# Patient Record
Sex: Female | Born: 1940 | Race: Black or African American | Hispanic: No | Marital: Married
Health system: Southern US, Community
[De-identification: ages and names within clinical notes are randomized; demographics above are authoritative.]

## PROBLEM LIST (undated history)

## (undated) DIAGNOSIS — M79661 Pain in right lower leg: Secondary | ICD-10-CM

## (undated) DIAGNOSIS — N189 Chronic kidney disease, unspecified: Secondary | ICD-10-CM

## (undated) DIAGNOSIS — E119 Type 2 diabetes mellitus without complications: Secondary | ICD-10-CM

## (undated) DIAGNOSIS — I4891 Unspecified atrial fibrillation: Secondary | ICD-10-CM

## (undated) DIAGNOSIS — H43399 Other vitreous opacities, unspecified eye: Secondary | ICD-10-CM

## (undated) DIAGNOSIS — E7800 Pure hypercholesterolemia, unspecified: Secondary | ICD-10-CM

## (undated) DIAGNOSIS — J3489 Other specified disorders of nose and nasal sinuses: Secondary | ICD-10-CM

## (undated) DIAGNOSIS — R001 Bradycardia, unspecified: Secondary | ICD-10-CM

## (undated) DIAGNOSIS — M79672 Pain in left foot: Secondary | ICD-10-CM

## (undated) DIAGNOSIS — T783XXA Angioneurotic edema, initial encounter: Secondary | ICD-10-CM

## (undated) DIAGNOSIS — I10 Essential (primary) hypertension: Secondary | ICD-10-CM

## (undated) DIAGNOSIS — M79671 Pain in right foot: Secondary | ICD-10-CM

## (undated) HISTORY — DX: Other specified disorders of nose and nasal sinuses: J34.89

## (undated) HISTORY — DX: Pain in right foot: M79.671

## (undated) HISTORY — DX: Bradycardia, unspecified: R00.1

## (undated) HISTORY — DX: Unspecified atrial fibrillation: I48.91

## (undated) HISTORY — PX: PACEMAKER IMPLANT: EP1218

## (undated) HISTORY — PX: LEG SURGERY: SHX1003

## (undated) HISTORY — DX: Pain in right lower leg: M79.661

## (undated) HISTORY — DX: Chronic kidney disease, unspecified: N18.9

## (undated) HISTORY — DX: Other vitreous opacities, unspecified eye: H43.399

## (undated) HISTORY — DX: Angioneurotic edema, initial encounter: T78.3XXA

## (undated) HISTORY — DX: Pain in right foot: M79.672

## (undated) NOTE — *Deleted (*Deleted)
   08/26/20 1428  Assess: MEWS Score  BP (!) 206/73  Pulse Rate 77  SpO2 100 %  O2 Device Room Air  Assess: MEWS Score  MEWS Temp 0  MEWS Systolic 2  MEWS Pulse 0  MEWS RR 0  MEWS LOC 0  MEWS Score 2  MEWS Score Color Yellow  Treat  MEWS Interventions Administered scheduled meds/treatments;Administered prn meds/treatments  Pain Scale 0-10  Pain Score 0  Take Vital Signs  Increase Vital Sign Frequency  Yellow: Q 2hr X 2 then Q 4hr X 2, if remains yellow, continue Q 4hrs  Escalate  MEWS: Escalate Yellow: discuss with charge nurse/RN and consider discussing with provider and RRT  Notify: Charge Nurse/RN  Name of Charge Nurse/RN Notified Luevenia Maxin, RN  Date Charge Nurse/RN Notified 08/26/20  Time Charge Nurse/RN Notified [id number]  Notify: Provider  Provider Name/Title [facility]  Date Provider Notified 08/26/20  Time Provider Notified 1440  Notification Type Page  Notification Reason Other (Comment) (High blood pressure)  Response No new orders  Date of Provider Response 08/26/20  Time of Provider Response 1500  Notify: Rapid Response  Name of Rapid Response RN Notified N/A  Document  Patient Outcome Stabilized after interventions  Progress note created (see row info) Yes  Pt's b/p consistently high.  MD notified.  Per MD give Hydralazine 5mg  PRN first.  PRN meds given. Will continue to monitor

---

## 2013-06-23 ENCOUNTER — Emergency Department (HOSPITAL_BASED_OUTPATIENT_CLINIC_OR_DEPARTMENT_OTHER)
Admission: EM | Admit: 2013-06-23 | Discharge: 2013-06-23 | Disposition: A | Discharge status: Home / Self Care | Payer: Medicare Other | Attending: Emergency Medicine | Admitting: Emergency Medicine

## 2013-06-23 ENCOUNTER — Emergency Department (HOSPITAL_BASED_OUTPATIENT_CLINIC_OR_DEPARTMENT_OTHER): Payer: Medicare Other

## 2013-06-23 ENCOUNTER — Encounter (HOSPITAL_BASED_OUTPATIENT_CLINIC_OR_DEPARTMENT_OTHER): Payer: Self-pay

## 2013-06-23 DIAGNOSIS — H6121 Impacted cerumen, right ear: Secondary | ICD-10-CM

## 2013-06-23 DIAGNOSIS — E7800 Pure hypercholesterolemia, unspecified: Secondary | ICD-10-CM | POA: Insufficient documentation

## 2013-06-23 DIAGNOSIS — R51 Headache: Secondary | ICD-10-CM | POA: Insufficient documentation

## 2013-06-23 DIAGNOSIS — R42 Dizziness and giddiness: Secondary | ICD-10-CM | POA: Insufficient documentation

## 2013-06-23 DIAGNOSIS — E119 Type 2 diabetes mellitus without complications: Secondary | ICD-10-CM | POA: Insufficient documentation

## 2013-06-23 DIAGNOSIS — I10 Essential (primary) hypertension: Secondary | ICD-10-CM | POA: Insufficient documentation

## 2013-06-23 DIAGNOSIS — Z79899 Other long term (current) drug therapy: Secondary | ICD-10-CM | POA: Insufficient documentation

## 2013-06-23 DIAGNOSIS — Z7982 Long term (current) use of aspirin: Secondary | ICD-10-CM | POA: Insufficient documentation

## 2013-06-23 DIAGNOSIS — H6120 Impacted cerumen, unspecified ear: Secondary | ICD-10-CM | POA: Insufficient documentation

## 2013-06-23 HISTORY — DX: Pure hypercholesterolemia, unspecified: E78.00

## 2013-06-23 HISTORY — DX: Essential (primary) hypertension: I10

## 2013-06-23 HISTORY — DX: Type 2 diabetes mellitus without complications: E11.9

## 2013-06-23 LAB — URINALYSIS, ROUTINE W REFLEX MICROSCOPIC
Bilirubin Urine: NEGATIVE
Leukocytes, UA: NEGATIVE
Nitrite: NEGATIVE
Specific Gravity, Urine: 1.004 — ABNORMAL LOW (ref 1.005–1.030)
Urobilinogen, UA: 0.2 mg/dL (ref 0.0–1.0)
pH: 6.5 (ref 5.0–8.0)

## 2013-06-23 LAB — BASIC METABOLIC PANEL
Chloride: 104 mEq/L (ref 96–112)
GFR calc Af Amer: 51 mL/min — ABNORMAL LOW (ref >90)
GFR calc non Af Amer: 44 mL/min — ABNORMAL LOW (ref >90)
Potassium: 3.6 mEq/L (ref 3.5–5.1)
Sodium: 142 mEq/L (ref 135–145)

## 2013-06-23 LAB — CBC WITH DIFFERENTIAL/PLATELET
Basophils Absolute: 0 10*3/uL (ref 0.0–0.1)
Basophils Relative: 0 % (ref 0–1)
Eosinophils Absolute: 0.1 10*3/uL (ref 0.0–0.7)
Hemoglobin: 12.3 g/dL (ref 12.0–15.0)
MCH: 31.1 pg (ref 26.0–34.0)
MCHC: 33.4 g/dL (ref 30.0–36.0)
Monocytes Relative: 9 % (ref 3–12)
Neutro Abs: 3.4 10*3/uL (ref 1.7–7.7)
Neutrophils Relative %: 63 % (ref 43–77)
Platelets: 276 10*3/uL (ref 150–400)
RDW: 13 % (ref 11.5–15.5)

## 2013-06-23 LAB — GLUCOSE, CAPILLARY: Glucose-Capillary: 122 mg/dL — ABNORMAL HIGH (ref 70–99)

## 2013-06-23 MED ORDER — MECLIZINE HCL 25 MG PO TABS
25.0000 mg | ORAL_TABLET | Freq: Once | ORAL | Status: AC
  Administered 2013-06-23: 25 mg via ORAL
  Filled 2013-06-23: qty 1

## 2013-06-23 MED ORDER — SODIUM CHLORIDE 0.9 % IV BOLUS (SEPSIS)
1000.0000 mL | Freq: Once | INTRAVENOUS | Status: AC
  Administered 2013-06-23: 1000 mL via INTRAVENOUS

## 2013-06-23 MED ORDER — MECLIZINE HCL 25 MG PO TABS: 25.0000 mg | ORAL_TABLET | Freq: Three times a day (TID) | ORAL | Status: DC | PRN

## 2013-06-23 NOTE — ED Notes (Signed)
Pt. Is in no distress with no s/s of shortness of breath noted.  Pt. Has no complaints.

## 2013-06-23 NOTE — ED Provider Notes (Signed)
CSN: [id number]     Arrival date & time 06/23/13  1310 History     First MD Initiated Contact with Patient 06/23/13 1343     Chief Complaint  Patient presents with  . Dizziness   (Consider location/radiation/quality/duration/timing/severity/associated sxs/prior Treatment) HPI Pt with history of HTN, DM reports she was sitting in her house earlier today when she began to have room-spinning dizziness, associated with headache diffuse mild and throbbing, not associated with ringing in ears, ataxia or nausea. She has not had similar symptoms before. No previous CAD or stroke.   Past Medical History  Diagnosis Date  . Diabetes mellitus without complication   . Hypertension   . High cholesterol    Past Surgical History  Procedure Laterality Date  . Leg surgery     No family history on file. History  Substance Use Topics  . Smoking status: Never Smoker   . Smokeless tobacco: Not on file  . Alcohol Use: Yes   OB History   Grav Para Term Preterm Abortions TAB SAB Ect Mult Living                 Review of Systems All other systems reviewed and are negative except as noted in HPI.   Allergies  Ace inhibitors; Augmentin; Benicar; and Ketek  Home Medications   Current Outpatient Rx  Name  Route  Sig  Dispense  Refill  . aspirin 81 MG tablet   Oral   Take 81 mg by mouth daily.         Marland Kitchen METFORMIN HCL PO   Oral   Take by mouth.         Marland Kitchen NIFEdipine (PROCARDIA PO)   Oral   Take by mouth.         . OMEPRAZOLE PO   Oral   Take by mouth.         Marland Kitchen PRAVASTATIN SODIUM PO   Oral   Take by mouth.         . SitaGLIPtin Phosphate (JANUVIA PO)   Oral   Take by mouth.          BP 173/66  Pulse 83  Temp(Src) 98.8 F (37.1 C) (Oral)  Resp 20  Ht 5\' 4"  (1.626 m)  Wt 174 lb (78.926 kg)  BMI 29.85 kg/m2  SpO2 96% Physical Exam  Nursing note and vitals reviewed. Constitutional: She is oriented to person, place, and time. She appears well-developed and  well-nourished.  HENT:  Head: Normocephalic and atraumatic.  R TM obscured by cerumen impaction; left side is clear with normal TM  Eyes: EOM are normal. Pupils are equal, round, and reactive to light.  Neck: Normal range of motion. Neck supple.  Cardiovascular: Normal rate, normal heart sounds and intact distal pulses.   Pulmonary/Chest: Effort normal and breath sounds normal.  Abdominal: Bowel sounds are normal. She exhibits no distension. There is no tenderness.  Musculoskeletal: Normal range of motion. She exhibits no edema and no tenderness.  Neurological: She is alert and oriented to person, place, and time. She has normal strength. She displays normal reflexes. No cranial nerve deficit or sensory deficit. She exhibits normal muscle tone. Coordination normal.  Skin: Skin is warm and dry. No rash noted.  Psychiatric: She has a normal mood and affect.    ED Course   Procedures (including critical care time)  Cerumen disimpaction by curette, large amount of dry cerumen removed from R ear, TM clear. No perforation, drainage or bleeding.  Labs Reviewed  GLUCOSE, CAPILLARY - Abnormal; Notable for the following:    Glucose-Capillary 122 (*)    All other components within normal limits  BASIC METABOLIC PANEL - Abnormal; Notable for the following:    Glucose, Bld 137 (*)    Creatinine, Ser 1.20 (*)    GFR calc non Af Amer 44 (*)    GFR calc Af Amer 51 (*)    All other components within normal limits  URINALYSIS, ROUTINE W REFLEX MICROSCOPIC - Abnormal; Notable for the following:    Specific Gravity, Urine 1.004 (*)    All other components within normal limits  CBC WITH DIFFERENTIAL   Ct Head Wo Contrast  06/23/2013   *RADIOLOGY REPORT*  Clinical Data: Dizziness and headache.  CT HEAD WITHOUT CONTRAST  Technique:  Contiguous axial images were obtained from the base of the skull through the vertex without contrast.  Comparison: No priors.  Findings: Mild cerebral and cerebellar  atrophy is age appropriate. There are some patchy areas of very subtle decreased attenuation throughout the deep and periventricular white matter of the cerebral hemispheres bilaterally, compatible with mild chronic microvascular ischemic changes. No acute intracranial abnormalities.  Specifically, no evidence of acute intracranial hemorrhage, no definite findings of acute/subacute cerebral ischemia, no mass, mass effect, hydrocephalus or abnormal intra or extra-axial fluid collections.  Visualized paranasal sinuses and mastoids are well pneumatized.  No acute displaced skull fractures are identified.  IMPRESSION: 1.  No acute intracranial abnormalities. 2.  Mild cerebral and cerebellar atrophy and mild chronic microvascular ischemic changes in the cerebral white matter, as above.   Original Report Authenticated By: Vinnie Langton, M.D.   1. Vertigo   2. Cerumen impaction, right     MDM   Date: 06/23/2013  Rate: 71  Rhythm: normal sinus rhythm  QRS Axis: normal  Intervals: normal  ST/T Wave abnormalities: normal  Conduction Disutrbances:none  Narrative Interpretation:   Old EKG Reviewed: none available  Pt with mild peripheral vertigo symptoms, no ataxia, likely related to cerumen impaction. Advised meclizine if needed and PCP followup if symptoms persist.    Juanda Crumble B. Karle Starch, MD 06/23/13 1517

## 2013-06-23 NOTE — ED Notes (Signed)
C/o dizziness started 11am-HA started 1130am-pain is across forehead-denies n/v,speech/swallowing difficulty-pt was ambulated from ED WR to tx area w/o difficulty

## 2017-09-18 ENCOUNTER — Emergency Department (HOSPITAL_BASED_OUTPATIENT_CLINIC_OR_DEPARTMENT_OTHER)
Admission: EM | Admit: 2017-09-18 | Discharge: 2017-09-18 | Disposition: A | Discharge status: Home / Self Care | Payer: Medicare HMO | Attending: Physician Assistant | Admitting: Physician Assistant

## 2017-09-18 ENCOUNTER — Encounter (HOSPITAL_BASED_OUTPATIENT_CLINIC_OR_DEPARTMENT_OTHER): Payer: Self-pay

## 2017-09-18 DIAGNOSIS — Z7984 Long term (current) use of oral hypoglycemic drugs: Secondary | ICD-10-CM | POA: Insufficient documentation

## 2017-09-18 DIAGNOSIS — Z79899 Other long term (current) drug therapy: Secondary | ICD-10-CM | POA: Insufficient documentation

## 2017-09-18 DIAGNOSIS — E119 Type 2 diabetes mellitus without complications: Secondary | ICD-10-CM | POA: Diagnosis not present

## 2017-09-18 DIAGNOSIS — I10 Essential (primary) hypertension: Secondary | ICD-10-CM | POA: Diagnosis not present

## 2017-09-18 DIAGNOSIS — T783XXA Angioneurotic edema, initial encounter: Secondary | ICD-10-CM | POA: Diagnosis not present

## 2017-09-18 DIAGNOSIS — R600 Localized edema: Secondary | ICD-10-CM | POA: Diagnosis present

## 2017-09-18 DIAGNOSIS — Z7982 Long term (current) use of aspirin: Secondary | ICD-10-CM | POA: Diagnosis not present

## 2017-09-18 LAB — CBC WITH DIFFERENTIAL/PLATELET
BASOS PCT: 0 %
Basophils Absolute: 0 10*3/uL (ref 0.0–0.1)
EOS ABS: 0.2 10*3/uL (ref 0.0–0.7)
EOS PCT: 3 %
HEMATOCRIT: 37.5 % (ref 36.0–46.0)
Hemoglobin: 12.3 g/dL (ref 12.0–15.0)
Lymphocytes Relative: 26 %
Lymphs Abs: 1.8 10*3/uL (ref 0.7–4.0)
MCH: 30.8 pg (ref 26.0–34.0)
MCHC: 32.8 g/dL (ref 30.0–36.0)
MCV: 93.8 fL (ref 78.0–100.0)
MONO ABS: 0.6 10*3/uL (ref 0.1–1.0)
MONOS PCT: 9 %
NEUTROS ABS: 4.2 10*3/uL (ref 1.7–7.7)
Neutrophils Relative %: 62 %
PLATELETS: 261 10*3/uL (ref 150–400)
RBC: 4 MIL/uL (ref 3.87–5.11)
RDW: 13.1 % (ref 11.5–15.5)
WBC: 6.7 10*3/uL (ref 4.0–10.5)

## 2017-09-18 LAB — BASIC METABOLIC PANEL
Anion gap: 8 (ref 5–15)
BUN: 22 mg/dL — ABNORMAL HIGH (ref 6–20)
CHLORIDE: 107 mmol/L (ref 101–111)
CO2: 25 mmol/L (ref 22–32)
CREATININE: 1.36 mg/dL — AB (ref 0.44–1.00)
Calcium: 9.1 mg/dL (ref 8.9–10.3)
GFR, EST AFRICAN AMERICAN: 42 mL/min — AB (ref >60)
GFR, EST NON AFRICAN AMERICAN: 36 mL/min — AB (ref >60)
Glucose, Bld: 153 mg/dL — ABNORMAL HIGH (ref 65–99)
POTASSIUM: 3.5 mmol/L (ref 3.5–5.1)
Sodium: 140 mmol/L (ref 135–145)

## 2017-09-18 MED ORDER — METHYLPREDNISOLONE SODIUM SUCC 125 MG IJ SOLR
125.0000 mg | Freq: Once | INTRAMUSCULAR | Status: AC
  Administered 2017-09-18: 125 mg via INTRAVENOUS
  Filled 2017-09-18: qty 2

## 2017-09-18 MED ORDER — FAMOTIDINE IN NACL 20-0.9 MG/50ML-% IV SOLN
20.0000 mg | Freq: Once | INTRAVENOUS | Status: AC
  Administered 2017-09-18: 20 mg via INTRAVENOUS
  Filled 2017-09-18: qty 50

## 2017-09-18 MED ORDER — DIPHENHYDRAMINE HCL 25 MG PO CAPS
25.0000 mg | ORAL_CAPSULE | Freq: Once | ORAL | Status: AC
Start: 2017-09-18 — End: 2017-09-18
  Administered 2017-09-18: 25 mg via ORAL
  Filled 2017-09-18: qty 1

## 2017-09-18 MED ORDER — DIPHENHYDRAMINE HCL 25 MG PO CAPS: 50.0000 mg | ORAL_CAPSULE | Freq: Once | ORAL | Status: DC

## 2017-09-18 NOTE — Discharge Instructions (Signed)
Please stay away from any products that have caused swelling.  Please return to follow-up with the primary care physician.

## 2017-09-18 NOTE — ED Triage Notes (Signed)
Pt woke with her tongue swollen around 0400, she took 25mg  of benadryl at that time.  Pt is not having any difficulty breathing, able to speak in full sentences.  The only change to her medications is an addition of a vaginal cream, no new foods either

## 2017-09-18 NOTE — ED Provider Notes (Signed)
[facility] Provider Note: Georgena Spurling, MD, FACEP  CSN: [id number] MRN: [id number] ARRIVAL: 09/18/17 at [place]: MHOTF/OTF   CHIEF COMPLAINT  Oral Swelling   HISTORY OF PRESENT ILLNESS  09/18/17 6:37 AM Angie Barrett is a 76 y.o. 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  She is not currently on either class of drug.  She was recently started on vaginal clotrimazole.  She is here with swelling of her tongue which began about 4 AM.  She took 25 mg of Benadryl orally at that time.  She denies difficulty breathing or difficulty speaking but did have difficulty swallowing before taking the Benadryl.  Symptoms are mild to moderate at the present time.  She has chronic edema of the lower extremities for which she wears compression stockings.   Past Medical History:  Diagnosis Date  . Diabetes mellitus without complication ([place])   . High cholesterol   . Hypertension     Past Surgical History:  Procedure Laterality Date  . LEG SURGERY      No family history on file.  Social History  Substance Use Topics  . Smoking status: Never Smoker  . Smokeless tobacco: Not on file  . Alcohol use Yes    Prior to Admission medications   Medication Sig Start Date End Date Taking? Authorizing Provider  aspirin 81 MG tablet Take 81 mg by mouth daily.    [provider]  meclizine (ANTIVERT) 25 MG tablet Take 1 tablet (25 mg total) by mouth 3 (three) times daily as needed. 06/23/13   Calvert Cantor, MD  METFORMIN HCL PO Take by mouth.    [provider]  NIFEdipine (PROCARDIA PO) Take by mouth.    [provider]  OMEPRAZOLE PO Take by mouth.    [provider]  PRAVASTATIN SODIUM PO Take by mouth.    [provider]  SitaGLIPtin Phosphate (JANUVIA PO) Take by mouth.    [provider]    Allergies Ace inhibitors; Augmentin [amoxicillin-pot clavulanate]; Benicar  [olmesartan]; and Ketek [telithromycin]   REVIEW OF SYSTEMS  Negative except as noted here or in the History of Present Illness.   PHYSICAL EXAMINATION  Initial Vital Signs Blood pressure (!) 183/74, pulse 82, temperature 98.6 F (37 C), temperature source Oral, resp. rate 18, height 5\' 4"  (1.626 m), weight 78.9 kg (174 lb), SpO2 100 %.  Examination General: Well-developed, well-nourished female in no acute distress; appearance consistent with age of record HENT: normocephalic; atraumatic; angioedema of tongue; no dysphonia; no stridor Eyes: pupils equal, round and reactive to light; extraocular muscles intact; lens implants Neck: supple Heart: regular rate and rhythm Lungs: clear to auscultation bilaterally Abdomen: soft; nondistended; nontender; bowel sounds present Extremities: No deformity; full range of motion; pulses normal; +1 edema of lower legs Neurologic: Awake, alert and oriented; motor function intact in all extremities and symmetric; no facial droop Skin: Warm and dry Psychiatric: Flat affect   RESULTS  Summary of this visit's results, reviewed by myself:   EKG Interpretation  Date/Time:    Ventricular Rate:    PR Interval:    QRS Duration:   QT Interval:    QTC Calculation:   R Axis:     Text Interpretation:        Laboratory Studies: Results for orders placed or performed during the hospital encounter of 09/18/17 (from the past 24 hour(s))  CBC with Differential/Platelet     Status: None   Collection Time:  09/18/17  5:51 AM  Result Value Ref Range   WBC 6.7 4.0 - 10.5 K/uL   RBC 4.00 3.87 - 5.11 MIL/uL   Hemoglobin 12.3 12.0 - 15.0 g/dL   HCT 37.5 36.0 - 46.0 %   MCV 93.8 78.0 - 100.0 fL   MCH 30.8 26.0 - 34.0 pg   MCHC 32.8 30.0 - 36.0 g/dL   RDW 13.1 11.5 - 15.5 %   Platelets 261 150 - 400 K/uL   Neutrophils Relative % 62 %   Neutro Abs 4.2 1.7 - 7.7 K/uL   Lymphocytes Relative 26 %   Lymphs Abs 1.8 0.7 - 4.0 K/uL   Monocytes Relative 9 %     Monocytes Absolute 0.6 0.1 - 1.0 K/uL   Eosinophils Relative 3 %   Eosinophils Absolute 0.2 0.0 - 0.7 K/uL   Basophils Relative 0 %   Basophils Absolute 0.0 0.0 - 0.1 K/uL  Basic metabolic panel     Status: Abnormal   Collection Time: 09/18/17  5:51 AM  Result Value Ref Range   Sodium 140 135 - 145 mmol/L   Potassium 3.5 3.5 - 5.1 mmol/L   Chloride 107 101 - 111 mmol/L   CO2 25 22 - 32 mmol/L   Glucose, Bld 153 (H) 65 - 99 mg/dL   BUN 22 (H) 6 - 20 mg/dL   Creatinine, Ser 1.36 (H) 0.44 - 1.00 mg/dL   Calcium 9.1 8.9 - 10.3 mg/dL   GFR calc non Af Amer 36 (L) >60 mL/min   GFR calc Af Amer 42 (L) >60 mL/min   Anion gap 8 5 - 15   Imaging Studies: No results found.  ED COURSE  Nursing notes and initial vitals signs, including pulse oximetry, reviewed.  Vitals:   09/18/17 0540 09/18/17 0915  BP: (!) 183/74 (!) 149/72  Pulse: 82 68  Resp: 18 18  Temp: 98.6 F (37 C)   TempSrc: Oral   SpO2: 100% 97%  Weight: 78.9 kg (174 lb)   Height: 5\' 4"  (1.626 m)    6:47 AM Patient states she is feeling better after additional 25 mg of Benadryl, 20 mg of IV Pepcid and 125 mg of IV Solu-Medrol.  Angioedema is still present.  We will continue to observe.    PROCEDURES    ED DIAGNOSES     ICD-10-CM   1. Angioedema, initial encounter T78.Mechele Dawley, MD 09/18/17 2243

## 2017-09-20 ENCOUNTER — Other Ambulatory Visit: Payer: Self-pay

## 2017-09-20 ENCOUNTER — Emergency Department (HOSPITAL_BASED_OUTPATIENT_CLINIC_OR_DEPARTMENT_OTHER): Payer: Medicare HMO

## 2017-09-20 ENCOUNTER — Encounter (HOSPITAL_BASED_OUTPATIENT_CLINIC_OR_DEPARTMENT_OTHER): Payer: Self-pay | Admitting: Emergency Medicine

## 2017-09-20 ENCOUNTER — Emergency Department (HOSPITAL_BASED_OUTPATIENT_CLINIC_OR_DEPARTMENT_OTHER)
Admission: EM | Admit: 2017-09-20 | Discharge: 2017-09-20 | Disposition: A | Discharge status: Home / Self Care | Payer: Medicare HMO | Attending: Emergency Medicine | Admitting: Emergency Medicine

## 2017-09-20 DIAGNOSIS — Z7984 Long term (current) use of oral hypoglycemic drugs: Secondary | ICD-10-CM | POA: Diagnosis not present

## 2017-09-20 DIAGNOSIS — I10 Essential (primary) hypertension: Secondary | ICD-10-CM | POA: Insufficient documentation

## 2017-09-20 DIAGNOSIS — R600 Localized edema: Secondary | ICD-10-CM | POA: Insufficient documentation

## 2017-09-20 DIAGNOSIS — E119 Type 2 diabetes mellitus without complications: Secondary | ICD-10-CM | POA: Insufficient documentation

## 2017-09-20 DIAGNOSIS — R42 Dizziness and giddiness: Secondary | ICD-10-CM

## 2017-09-20 DIAGNOSIS — R0602 Shortness of breath: Secondary | ICD-10-CM | POA: Diagnosis present

## 2017-09-20 DIAGNOSIS — Z7982 Long term (current) use of aspirin: Secondary | ICD-10-CM | POA: Insufficient documentation

## 2017-09-20 LAB — CBC WITH DIFFERENTIAL/PLATELET
BASOS ABS: 0.1 10*3/uL (ref 0.0–0.1)
Basophils Relative: 1 %
EOS ABS: 0.1 10*3/uL (ref 0.0–0.7)
EOS PCT: 1 %
HCT: 38.7 % (ref 36.0–46.0)
HEMOGLOBIN: 13 g/dL (ref 12.0–15.0)
LYMPHS ABS: 1.7 10*3/uL (ref 0.7–4.0)
LYMPHS PCT: 22 %
MCH: 31 pg (ref 26.0–34.0)
MCHC: 33.6 g/dL (ref 30.0–36.0)
MCV: 92.1 fL (ref 78.0–100.0)
Monocytes Absolute: 0.6 10*3/uL (ref 0.1–1.0)
Monocytes Relative: 7 %
NEUTROS PCT: 69 %
Neutro Abs: 5.3 10*3/uL (ref 1.7–7.7)
Platelets: 284 10*3/uL (ref 150–400)
RBC: 4.2 MIL/uL (ref 3.87–5.11)
RDW: 13.1 % (ref 11.5–15.5)
WBC: 7.7 10*3/uL (ref 4.0–10.5)

## 2017-09-20 LAB — BASIC METABOLIC PANEL
ANION GAP: 13 (ref 5–15)
BUN: 20 mg/dL (ref 6–20)
CHLORIDE: 101 mmol/L (ref 101–111)
CO2: 21 mmol/L — ABNORMAL LOW (ref 22–32)
Calcium: 9.4 mg/dL (ref 8.9–10.3)
Creatinine, Ser: 1.5 mg/dL — ABNORMAL HIGH (ref 0.44–1.00)
GFR calc Af Amer: 38 mL/min — ABNORMAL LOW (ref >60)
GFR calc non Af Amer: 32 mL/min — ABNORMAL LOW (ref >60)
GLUCOSE: 229 mg/dL — AB (ref 65–99)
POTASSIUM: 3.6 mmol/L (ref 3.5–5.1)
Sodium: 135 mmol/L (ref 135–145)

## 2017-09-20 LAB — CBG MONITORING, ED: GLUCOSE-CAPILLARY: 236 mg/dL — AB (ref 65–99)

## 2017-09-20 LAB — D-DIMER, QUANTITATIVE: D-Dimer, Quant: 0.93 ug/mL-FEU — ABNORMAL HIGH (ref 0.00–0.50)

## 2017-09-20 LAB — TROPONIN I: Troponin I: <0.03 ng/mL (ref <0.03)

## 2017-09-20 MED ORDER — SODIUM CHLORIDE 0.9 % IV BOLUS (SEPSIS)
250.0000 mL | Freq: Once | INTRAVENOUS | Status: AC
  Administered 2017-09-20: 250 mL via INTRAVENOUS

## 2017-09-20 NOTE — ED Triage Notes (Signed)
Reports shortness of breath x 1 hour PTA.  Reports dizziness as well.  Denies chest pain.

## 2017-09-20 NOTE — ED Provider Notes (Signed)
[facility] EMERGENCY DEPARTMENT Provider Note   CSN: [id number] Arrival date & time: 09/20/17  1155     History   Chief Complaint Chief Complaint  Patient presents with  . Shortness of Breath    HPI Angie Barrett is a 76 y.o. female.  The history is provided by the patient. No language interpreter was used.  Shortness of Breath    Angie Barrett is a 76 y.o. female who presents to the Emergency Department complaining of sob.  She reports shortness of breath and dizziness that happened about 1 hour prior to arrival.  Symptoms occurred while she was at rest.  She states that she feels like it is difficult to breathe and she feels like she might pass out.  Symptoms are constant in nature.  No fevers, chest pain, cough, abdominal pain, nausea, vomiting, diaphoresis.  She has chronic lower extremity edema and is unchanged from her baseline. Past Medical History:  Diagnosis Date  . Diabetes mellitus without complication ([place])   . High cholesterol   . Hypertension     There are no active problems to display for this patient.   Past Surgical History:  Procedure Laterality Date  . LEG SURGERY      OB History    No data available       Home Medications    Prior to Admission medications   Medication Sig Start Date End Date Taking? Authorizing Provider  aspirin 81 MG tablet Take 81 mg by mouth daily.    [provider]  meclizine (ANTIVERT) 25 MG tablet Take 1 tablet (25 mg total) by mouth 3 (three) times daily as needed. 06/23/13   Calvert Cantor, MD  METFORMIN HCL PO Take by mouth.    [provider]  NIFEdipine (PROCARDIA PO) Take by mouth.    [provider]  OMEPRAZOLE PO Take by mouth.    [provider]  PRAVASTATIN SODIUM PO Take by mouth.    [provider]  SitaGLIPtin Phosphate (JANUVIA PO) Take by mouth.    [provider]    Family History History reviewed. No pertinent family history.  Social  History Social History   Tobacco Use  . Smoking status: Never Smoker  . Smokeless tobacco: Never Used  Substance Use Topics  . Alcohol use: Yes  . Drug use: Not on file     Allergies   Ace inhibitors; Augmentin [amoxicillin-pot clavulanate]; Benicar [olmesartan]; and Ketek [telithromycin]   Review of Systems Review of Systems  Respiratory: Positive for shortness of breath.   All other systems reviewed and are negative.    Physical Exam Updated Vital Signs BP (!) 147/57   Pulse 76   Temp 98.1 F (36.7 C) (Oral)   Resp 17   Ht 5\' 4"  (1.626 m)   Wt 78.5 kg (173 lb)   SpO2 100%   BMI 29.70 kg/m   Physical Exam  Constitutional: She is oriented to person, place, and time. She appears well-developed and well-nourished.  HENT:  Head: Normocephalic and atraumatic.  Cardiovascular: Normal rate and regular rhythm.  No murmur heard. Pulmonary/Chest: Effort normal and breath sounds normal. No respiratory distress.  Abdominal: Soft. There is no tenderness. There is no rebound and no guarding.  Musculoskeletal: She exhibits no edema or tenderness.  Nonpitting edema to bilateral lower extremities.  No calf tenderness.  Neurological: She is alert and oriented to person, place, and time.  Skin: Skin is warm and dry.  Psychiatric: She has a normal  mood and affect. Her behavior is normal.  Nursing note and vitals reviewed.    ED Treatments / Results  Labs (all labs ordered are listed, but only abnormal results are displayed) Labs Reviewed  BASIC METABOLIC PANEL - Abnormal; Notable for the following components:      Result Value   CO2 21 (*)    Glucose, Bld 229 (*)    Creatinine, Ser 1.50 (*)    GFR calc non Af Amer 32 (*)    GFR calc Af Amer 38 (*)    All other components within normal limits  D-DIMER, QUANTITATIVE (NOT AT [facility]) - Abnormal; Notable for the following components:   D-Dimer, Quant 0.93 (*)    All other components within normal limits  CBG MONITORING, ED -  Abnormal; Notable for the following components:   Glucose-Capillary 236 (*)    All other components within normal limits  TROPONIN I  CBC WITH DIFFERENTIAL/PLATELET    EKG  EKG Interpretation  Date/Time:  Monday September 20 2017 12:11:19 EST Ventricular Rate:  85 PR Interval:    QRS Duration: 94 QT Interval:  413 QTC Calculation: 492 R Axis:   -9 Text Interpretation:  Sinus rhythm Borderline T wave abnormalities Borderline prolonged QT interval Baseline wander in lead(s) I II III aVR aVL aVF Confirmed by Quintella Reichert [phone]) on 09/20/2017 12:26:13 PM       Radiology Dg Chest 2 View  Result Date: 09/20/2017 CLINICAL DATA:  Acute onset of shortness of breath 1 hour ago associated with dizziness. Current history of hypertension, diabetes and hypercholesterolemia. EXAM: CHEST  2 VIEW COMPARISON:  08/31/2006. FINDINGS: Cardiac silhouette normal in size, unchanged. Thoracic aorta mildly atherosclerotic, unchanged. Hilar and mediastinal contours otherwise unremarkable. Lungs clear. Bronchovascular markings normal. Pulmonary vascularity normal. No visible pleural effusions. No pneumothorax. Degenerative changes involving the thoracic and upper lumbar spine. IMPRESSION: 1.  No acute cardiopulmonary disease. 2.  Aortic Atherosclerosis (ICD10-170.0) Electronically Signed   By: Evangeline Dakin M.D.   On: 09/20/2017 12:35   US Venous Img Lower Bilateral  Result Date: 09/20/2017 CLINICAL DATA:  Bilateral lower extremity swelling, shortness of breath. EXAM: BILATERAL LOWER EXTREMITY VENOUS DOPPLER ULTRASOUND TECHNIQUE: Gray-scale sonography with graded compression, as well as color Doppler and duplex ultrasound were performed to evaluate the lower extremity deep venous systems from the level of the common femoral vein and including the common femoral, femoral, profunda femoral, popliteal and calf veins including the posterior tibial, peroneal and gastrocnemius veins when visible. The superficial  great saphenous vein was also interrogated. Spectral Doppler was utilized to evaluate flow at rest and with distal augmentation maneuvers in the common femoral, femoral and popliteal veins. COMPARISON:  None. FINDINGS: RIGHT LOWER EXTREMITY Common Femoral Vein: No evidence of thrombus. Normal compressibility, respiratory phasicity and response to augmentation. Saphenofemoral Junction: No evidence of thrombus. Normal compressibility and flow on color Doppler imaging. Profunda Femoral Vein: No evidence of thrombus. Normal compressibility and flow on color Doppler imaging. Femoral Vein: No evidence of thrombus. Normal compressibility, respiratory phasicity and response to augmentation. Popliteal Vein: No evidence of thrombus. Normal compressibility, respiratory phasicity and response to augmentation. Calf Veins: No evidence of thrombus. Normal compressibility and flow on color Doppler imaging. Superficial Great Saphenous Vein: No evidence of thrombus. Normal compressibility. Venous Reflux:  None. Other Findings:  None. LEFT LOWER EXTREMITY Common Femoral Vein: No evidence of thrombus. Normal compressibility, respiratory phasicity and response to augmentation. Saphenofemoral Junction: No evidence of thrombus. Normal compressibility and flow on color Doppler  imaging. Profunda Femoral Vein: No evidence of thrombus. Normal compressibility and flow on color Doppler imaging. Femoral Vein: No evidence of thrombus. Normal compressibility, respiratory phasicity and response to augmentation. Popliteal Vein: No evidence of thrombus. Normal compressibility, respiratory phasicity and response to augmentation. Calf Veins: No evidence of thrombus. Normal compressibility and flow on color Doppler imaging. Superficial Great Saphenous Vein: No evidence of thrombus. Normal compressibility. Venous Reflux:  None. Other Findings:  None. IMPRESSION: No evidence of deep venous thrombosis seen in either lower extremity. Electronically Signed    By: Marijo Conception, M.D.   On: 09/20/2017 15:21    Procedures Procedures (including critical care time)  Medications Ordered in ED Medications  sodium chloride 0.9 % bolus 250 mL (0 mLs Intravenous Stopped 09/20/17 1453)     Initial Impression / Assessment and Plan / ED Course  I have reviewed the triage vital signs and the nursing notes.  Pertinent labs & imaging results that were available during my care of the patient were reviewed by me and considered in my medical decision making (see chart for details).     Patient here for evaluation of shortness of breath, lightheadedness.  Her symptoms have resolved in the emergency department without intervention.  Presentation is not consistent with ACS, dissection, acute CHF, pneumonia, PE.  D-dimer ordered in error and is slightly elevated, patient without PE risk factors and her sxs have resolved - do not feel additional work up for PE is indicated at this time.  Pt is not a candidate for CTA given her renal function  Discussed with patient unclear etiology of her shortness of breath and lightheadedness, now resolved.  Discussed outpatient follow-up and return precautions.   Final Clinical Impressions(s) / ED Diagnoses   Final diagnoses:  Shortness of breath  Lightheadedness    ED Discharge Orders    None       Quintella Reichert, MD 09/20/17 1536

## 2018-05-22 ENCOUNTER — Encounter (HOSPITAL_BASED_OUTPATIENT_CLINIC_OR_DEPARTMENT_OTHER): Payer: Self-pay | Admitting: Emergency Medicine

## 2018-05-22 ENCOUNTER — Other Ambulatory Visit: Payer: Self-pay

## 2018-05-22 ENCOUNTER — Emergency Department (HOSPITAL_BASED_OUTPATIENT_CLINIC_OR_DEPARTMENT_OTHER)
Admission: EM | Admit: 2018-05-22 | Discharge: 2018-05-23 | Disposition: A | Discharge status: Home / Self Care | Payer: Medicare HMO | Attending: Emergency Medicine | Admitting: Emergency Medicine

## 2018-05-22 DIAGNOSIS — I10 Essential (primary) hypertension: Secondary | ICD-10-CM

## 2018-05-22 DIAGNOSIS — E119 Type 2 diabetes mellitus without complications: Secondary | ICD-10-CM | POA: Diagnosis not present

## 2018-05-22 DIAGNOSIS — Z79899 Other long term (current) drug therapy: Secondary | ICD-10-CM | POA: Insufficient documentation

## 2018-05-22 DIAGNOSIS — Z7984 Long term (current) use of oral hypoglycemic drugs: Secondary | ICD-10-CM | POA: Insufficient documentation

## 2018-05-22 DIAGNOSIS — R519 Headache, unspecified: Secondary | ICD-10-CM

## 2018-05-22 DIAGNOSIS — Z7982 Long term (current) use of aspirin: Secondary | ICD-10-CM | POA: Insufficient documentation

## 2018-05-22 DIAGNOSIS — R51 Headache: Secondary | ICD-10-CM | POA: Insufficient documentation

## 2018-05-22 LAB — DIFFERENTIAL
BASOS PCT: 0 %
Basophils Absolute: 0 10*3/uL (ref 0.0–0.1)
EOS ABS: 0.1 10*3/uL (ref 0.0–0.7)
EOS PCT: 2 %
Lymphocytes Relative: 26 %
Lymphs Abs: 1.8 10*3/uL (ref 0.7–4.0)
MONO ABS: 0.6 10*3/uL (ref 0.1–1.0)
MONOS PCT: 10 %
Neutro Abs: 4.2 10*3/uL (ref 1.7–7.7)
Neutrophils Relative %: 62 %

## 2018-05-22 LAB — COMPREHENSIVE METABOLIC PANEL
ALBUMIN: 3.9 g/dL (ref 3.5–5.0)
ALT: 24 U/L (ref 0–44)
ANION GAP: 11 (ref 5–15)
AST: 25 U/L (ref 15–41)
Alkaline Phosphatase: 53 U/L (ref 38–126)
BUN: 29 mg/dL — ABNORMAL HIGH (ref 8–23)
CHLORIDE: 102 mmol/L (ref 98–111)
CO2: 28 mmol/L (ref 22–32)
Calcium: 8.9 mg/dL (ref 8.9–10.3)
Creatinine, Ser: 1.85 mg/dL — ABNORMAL HIGH (ref 0.44–1.00)
GFR calc Af Amer: 29 mL/min — ABNORMAL LOW (ref >60)
GFR, EST NON AFRICAN AMERICAN: 25 mL/min — AB (ref >60)
GLUCOSE: 164 mg/dL — AB (ref 70–99)
POTASSIUM: 3.6 mmol/L (ref 3.5–5.1)
Sodium: 141 mmol/L (ref 135–145)
TOTAL PROTEIN: 7.4 g/dL (ref 6.5–8.1)
Total Bilirubin: 0.3 mg/dL (ref 0.3–1.2)

## 2018-05-22 LAB — CBC
HEMATOCRIT: 37.4 % (ref 36.0–46.0)
Hemoglobin: 12.7 g/dL (ref 12.0–15.0)
MCH: 31.8 pg (ref 26.0–34.0)
MCHC: 34 g/dL (ref 30.0–36.0)
MCV: 93.7 fL (ref 78.0–100.0)
PLATELETS: 264 10*3/uL (ref 150–400)
RBC: 3.99 MIL/uL (ref 3.87–5.11)
RDW: 13 % (ref 11.5–15.5)
WBC: 6.8 10*3/uL (ref 4.0–10.5)

## 2018-05-22 LAB — TROPONIN I

## 2018-05-22 LAB — CBG MONITORING, ED: Glucose-Capillary: 160 mg/dL — ABNORMAL HIGH (ref 70–99)

## 2018-05-22 MED ORDER — HYDRALAZINE HCL 25 MG PO TABS: 25.0000 mg | ORAL_TABLET | Freq: Three times a day (TID) | ORAL | 0 refills | Status: DC

## 2018-05-22 MED ORDER — HYDRALAZINE HCL 25 MG PO TABS
25.0000 mg | ORAL_TABLET | Freq: Once | ORAL | Status: AC
  Administered 2018-05-22: 25 mg via ORAL
  Filled 2018-05-22: qty 1

## 2018-05-22 MED ORDER — ACETAMINOPHEN 500 MG PO TABS: 1000.0000 mg | ORAL_TABLET | Freq: Four times a day (QID) | ORAL | 0 refills | Status: AC | PRN

## 2018-05-22 MED ORDER — ACETAMINOPHEN 500 MG PO TABS
1000.0000 mg | ORAL_TABLET | Freq: Once | ORAL | Status: AC
  Administered 2018-05-22: 1000 mg via ORAL
  Filled 2018-05-22: qty 2

## 2018-05-22 NOTE — ED Triage Notes (Signed)
Patient states that she starred to have pain to the right side of her head about 2 days ago - the patient reports that it is now on the left side.

## 2018-05-22 NOTE — Discharge Instructions (Signed)
1.  Take hydralazine 25 mg 3 times a day.  If your blood pressure is less than 150/70, skip 1 dose.  And retake your blood pressure at the next scheduled dose. 2.  It is very important that you follow-up with your doctor as soon as possible this week for close blood pressure monitoring and response to treatment. 3.  You may also take acetaminophen every 6 hours for pain. 4.  Return to the emergency department if you develop worsening headache, weakness numbness or tingling, nausea or vomiting, changes in your vision or any other concerning symptoms.

## 2018-05-22 NOTE — ED Provider Notes (Signed)
[facility] EMERGENC[facility] DEPARTMENT Provider Note   CSN: [id number] Arrival date & time: 05/22/18  2004     History   Chief Complaint Chief Complaint  Patient presents with  . Headache    HPI Angie Barrett is a 77 y.o. female.  HPI Patient has had a waxing and waning headache for 2 days.  She reports it started on the left side around her temple and beside her ear.  Was gradual in onset a couple of days ago.  She reports the next day it moved to the left side in a similar area.  There is some aching and throbbing quality.  It  improved with Tylenol yesterday.   Patient initially denied that she actually had a headache at this time.  She sat forward and move her head about and then determined that it actually was back behind the right side again.  There have been no associated symptoms.  No nausea, no vomiting.  No visual changes.  No imbalance.  No weakness numbness or tingling.  Patient reports that she is compliant with her blood pressure medication.  She takes Procardia nightly.  She reports her blood pressure is usually well controlled, she reports systolic blood pressure typically in the 371I and diastolic in the 96V.(EMR review shows that ED visit [phone] blood pressure 183\74 and ED visit [phone] blood pressure 173/66 last outpatient PCP visit [phone] blood pressure 118/58) Past Medical History:  Diagnosis Date  . Diabetes mellitus without complication ([place])   . High cholesterol   . Hypertension     There are no active problems to display for this patient.   Past Surgical History:  Procedure Laterality Date  . LEG SURGERY       OB History   None      Home Medications    Prior to Admission medications   Medication Sig Start Date End Date Taking? Authorizing Provider  acetaminophen (TYLENOL) 500 MG tablet Take 2 tablets (1,000 mg total) by mouth every 6 (six) hours as needed. 05/22/18   Charlesetta Shanks, MD  aspirin 81 MG tablet Take 81 mg by mouth daily.     [provider]  hydrALAZINE (APRESOLINE) 25 MG tablet Take 1 tablet (25 mg total) by mouth 3 (three) times daily. 05/22/18   Charlesetta Shanks, MD  meclizine (ANTIVERT) 25 MG tablet Take 1 tablet (25 mg total) by mouth 3 (three) times daily as needed. 06/23/13   Calvert Cantor, MD  METFORMIN HCL PO Take by mouth.    [provider]  NIFEdipine (PROCARDIA PO) Take by mouth.    [provider]  OMEPRAZOLE PO Take by mouth.    [provider]  PRAVASTATIN SODIUM PO Take by mouth.    [provider]  SitaGLIPtin Phosphate (JANUVIA PO) Take by mouth.    [provider]    Family History History reviewed. No pertinent family history.  Social History Social History   Tobacco Use  . Smoking status: Never Smoker  . Smokeless tobacco: Never Used  Substance Use Topics  . Alcohol use: Yes  . Drug use: Not on file     Allergies   Ace inhibitors; Augmentin [amoxicillin-pot clavulanate]; Benicar [olmesartan]; and Ketek [telithromycin]   Review of Systems Review of Systems 10 Systems reviewed and are negative for acute change except as noted in the HPI.  Physical Exam Updated Vital Signs BP (!) 190/77   Pulse 66   Temp 98.8 F (37.1 C) (Oral)   Resp 13  Ht 5\' 4"  (1.626 m)   Wt 75.3 kg (166 lb)   SpO2 100%   BMI 28.49 kg/m   Physical Exam  Constitutional: She is oriented to person, place, and time. She appears well-developed and well-nourished. No distress.  HENT:  Head: Normocephalic and atraumatic.  Right Ear: External ear normal.  Left Ear: External ear normal.  Nose: Nose normal.  Mouth/Throat: Oropharynx is clear and moist.  Eyes: Pupils are equal, round, and reactive to light. EOM are normal.  Neck: Neck supple.  Cardiovascular: Normal rate, regular rhythm, normal heart sounds and intact distal pulses.  Pulmonary/Chest: Effort normal and breath sounds normal.  Abdominal: Soft. Bowel sounds are normal. She exhibits no  distension. There is no tenderness.  Musculoskeletal: Normal range of motion. She exhibits no edema.  Neurological: She is alert and oriented to person, place, and time. She has normal strength. No cranial nerve deficit. She exhibits normal muscle tone. Coordination normal. GCS eye subscore is 4. GCS verbal subscore is 5. GCS motor subscore is 6.  Skin: Skin is warm, dry and intact.  Psychiatric: She has a normal mood and affect.     ED Treatments / Results  Labs (all labs ordered are listed, but only abnormal results are displayed) Labs Reviewed  COMPREHENSIVE METABOLIC PANEL - Abnormal; Notable for the following components:      Result Value   Glucose, Bld 164 (*)    BUN 29 (*)    Creatinine, Ser 1.85 (*)    GFR calc non Af Amer 25 (*)    GFR calc Af Amer 29 (*)    All other components within normal limits  CBG MONITORING, ED - Abnormal; Notable for the following components:   Glucose-Capillary 160 (*)    All other components within normal limits  CBC  DIFFERENTIAL  TROPONIN I    EKG EKG Interpretation  Date/Time:  Sunday May 22 2018 20:22:31 EDT Ventricular Rate:  78 PR Interval:    QRS Duration: 93 QT Interval:  342 QTC Calculation: 390 R Axis:   -10 Text Interpretation:  Sinus rhythm Anteroseptal infarct, old Borderline repolarization abnormality Baseline wander in lead(s) II V1 V5 agree,no sig change from old Confirmed by Charlesetta Shanks [phone]) on 05/22/2018 11:23:13 PM   Radiology No results found.  Procedures Procedures (including critical care time)  Medications Ordered in ED Medications  hydrALAZINE (APRESOLINE) tablet 25 mg (25 mg Oral Given 05/22/18 2229)  acetaminophen (TYLENOL) tablet 1,000 mg (1,000 mg Oral Given 05/22/18 2229)     Initial Impression / Assessment and Plan / ED Course  I have reviewed the triage vital signs and the nursing notes.  Pertinent labs & imaging results that were available during my care of the patient were reviewed by me  and considered in my medical decision making (see chart for details).      Final Clinical Impressions(s) / ED Diagnoses   Final diagnoses:  Nonintractable episodic headache, unspecified headache type  Essential hypertension  She has had headaches that is been waxing and waning.  Is been migratory from one side of the head to the other.  No associated symptoms.  Patient has normal neurologic exam.  Past emergency department visits did show episodes of hypertension but her primary care visit most recently shows her to be normotensive.  She is significantly hypertensive today.  He however does not have signs of endorgan damage.  She has normal mental status, no chest pain, no signs of congestive heart failure.  Patient will  be started on hydralazine.  She is counseled on holding her dose if blood pressure is normalized.  Patient does not currently have a headache in the emergency department.  She is counseled on taking Tylenol from which she had gotten improvement yesterday with a headache.  Return precautions reviewed.  Patient is advised on how important close follow-up with her PCP for monitoring is.  ED Discharge Orders        Ordered    hydrALAZINE (APRESOLINE) 25 MG tablet  3 times daily     05/22/18 2330    acetaminophen (TYLENOL) 500 MG tablet  Every 6 hours PRN     05/22/18 2331       Charlesetta Shanks, MD 05/22/18 2346

## 2018-11-28 IMAGING — CR DG CHEST 2V
2 series · 2 of 2 positions shown · non-contrast
Comparison: 08/31/2006.

CLINICAL DATA: Acute onset of shortness of breath 1 hour ago
associated with dizziness. Current history of hypertension, diabetes
and hypercholesterolemia.

EXAM:
CHEST  2 VIEW

[w chest pa]
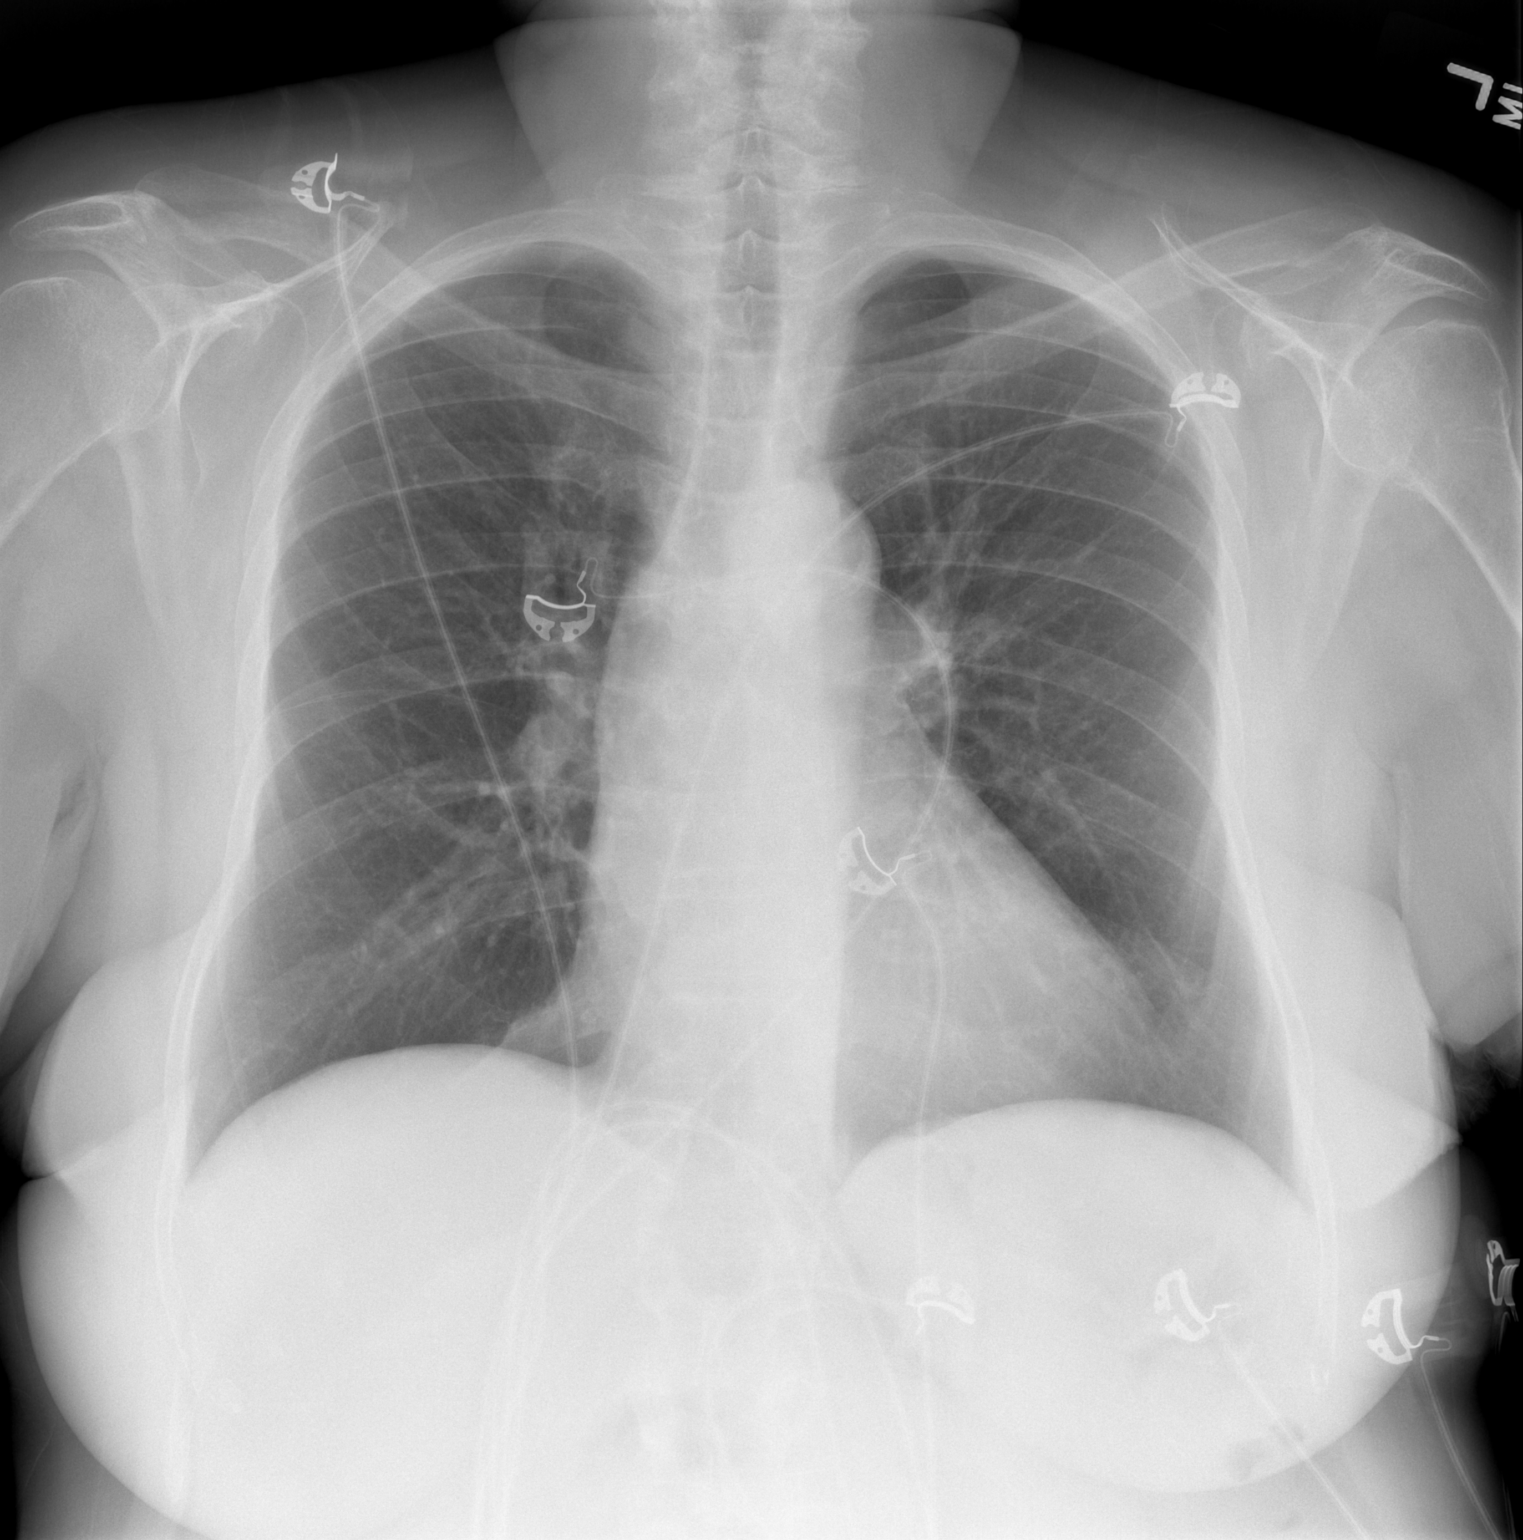

[w chest lat]
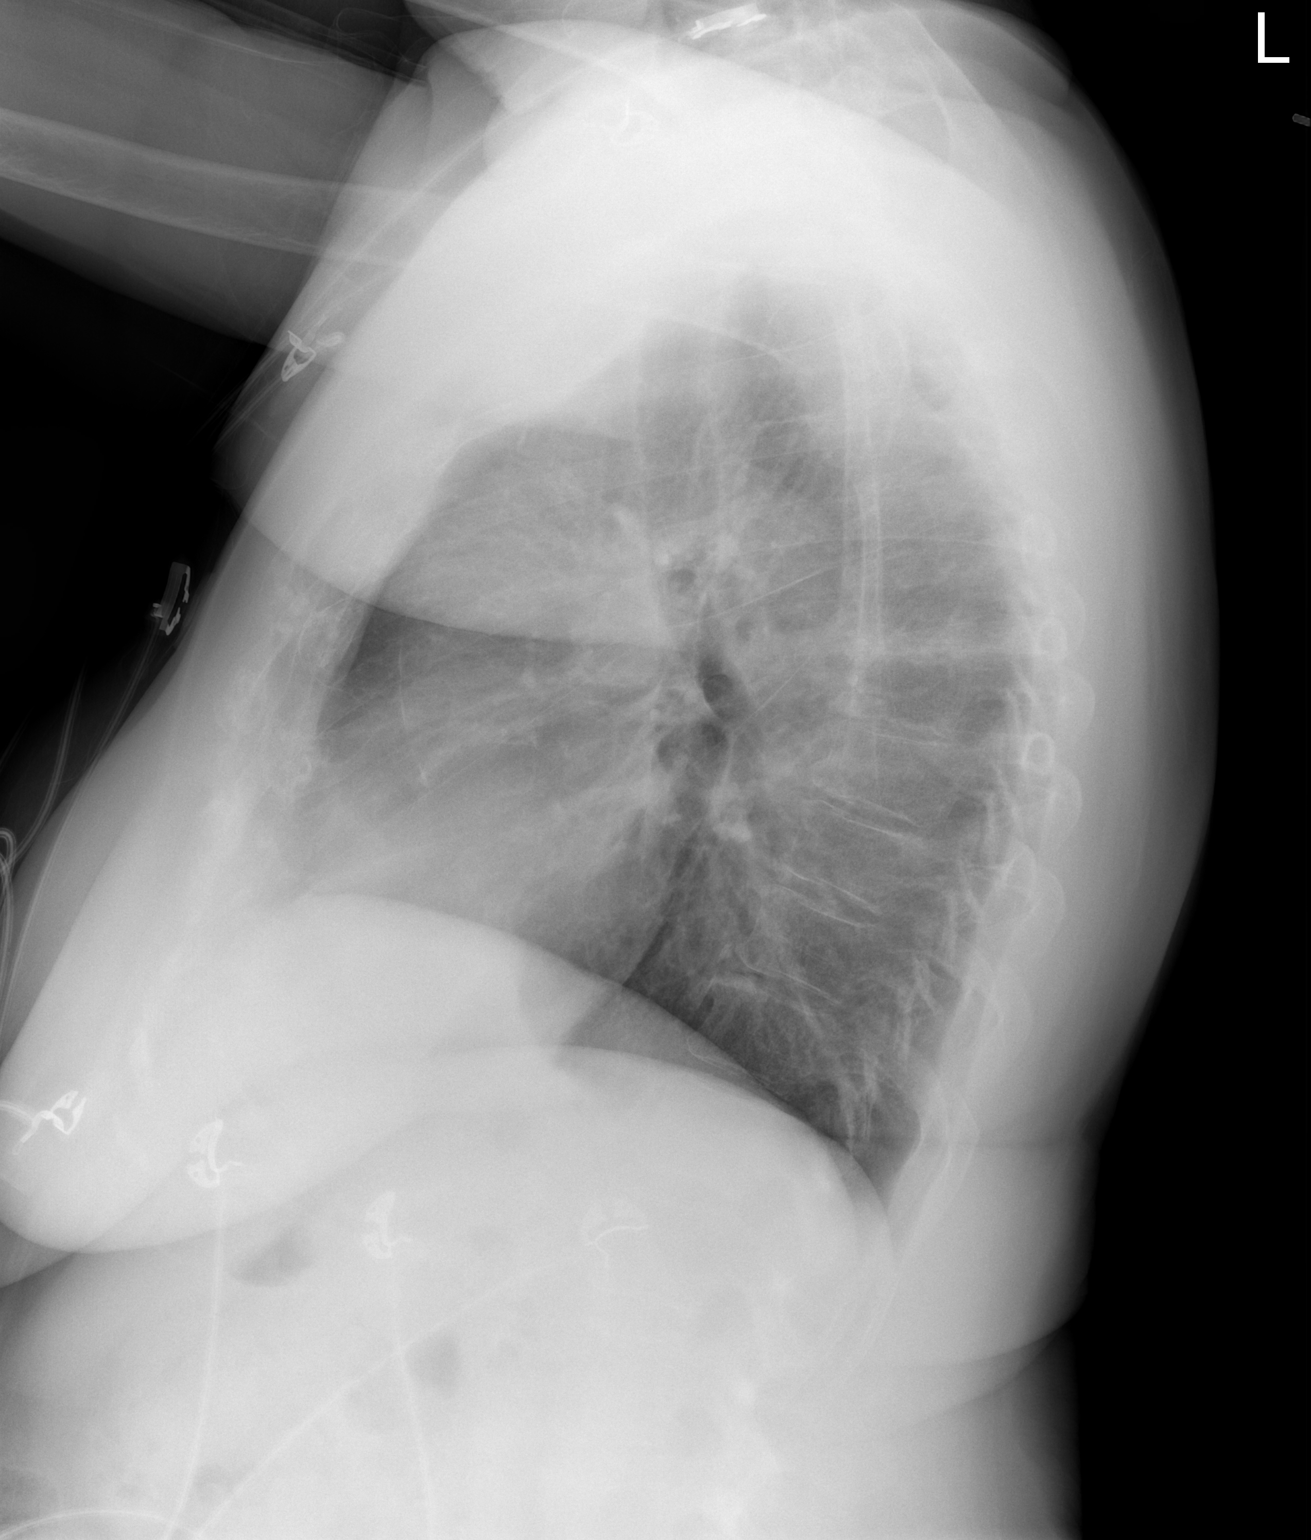

[2 of 2 positions shown; findings below may reference images not displayed]

FINDINGS: Cardiac silhouette normal in size, unchanged. Thoracic aorta mildly
atherosclerotic, unchanged. Hilar and mediastinal contours otherwise
unremarkable. Lungs clear. Bronchovascular markings normal.
Pulmonary vascularity normal. No visible pleural effusions. No
pneumothorax. Degenerative changes involving the thoracic and upper
lumbar spine.
IMPRESSION: 1.  No acute cardiopulmonary disease.
2.  Aortic Atherosclerosis ([id number]-170.0)

## 2019-07-03 ENCOUNTER — Other Ambulatory Visit: Payer: Self-pay

## 2019-07-03 ENCOUNTER — Emergency Department (HOSPITAL_BASED_OUTPATIENT_CLINIC_OR_DEPARTMENT_OTHER)
Admission: EM | Admit: 2019-07-03 | Discharge: 2019-07-03 | Disposition: A | Discharge status: Home / Self Care | Payer: Medicare HMO | Attending: Emergency Medicine | Admitting: Emergency Medicine

## 2019-07-03 ENCOUNTER — Encounter (HOSPITAL_BASED_OUTPATIENT_CLINIC_OR_DEPARTMENT_OTHER): Payer: Self-pay | Admitting: *Deleted

## 2019-07-03 DIAGNOSIS — E785 Hyperlipidemia, unspecified: Secondary | ICD-10-CM | POA: Insufficient documentation

## 2019-07-03 DIAGNOSIS — Z888 Allergy status to other drugs, medicaments and biological substances status: Secondary | ICD-10-CM | POA: Diagnosis not present

## 2019-07-03 DIAGNOSIS — Z990 Dependence on aspirator: Secondary | ICD-10-CM | POA: Insufficient documentation

## 2019-07-03 DIAGNOSIS — I10 Essential (primary) hypertension: Secondary | ICD-10-CM | POA: Diagnosis not present

## 2019-07-03 DIAGNOSIS — E119 Type 2 diabetes mellitus without complications: Secondary | ICD-10-CM | POA: Insufficient documentation

## 2019-07-03 DIAGNOSIS — Z79899 Other long term (current) drug therapy: Secondary | ICD-10-CM | POA: Diagnosis not present

## 2019-07-03 DIAGNOSIS — Z7984 Long term (current) use of oral hypoglycemic drugs: Secondary | ICD-10-CM | POA: Diagnosis not present

## 2019-07-03 DIAGNOSIS — Z7982 Long term (current) use of aspirin: Secondary | ICD-10-CM | POA: Diagnosis not present

## 2019-07-03 NOTE — ED Provider Notes (Signed)
[facility] EMERGENCY DEPARTMENT Provider Note   CSN: [id number] Arrival date & time: 07/03/19  1616    History   Chief Complaint Chief Complaint  Patient presents with  . Hypertension    HPI Angie Barrett is a 78 y.o. female.     HPI  Angie Barrett is a 78 y.o. female, with a history of DM, hypercholesterolemia, HTN, presenting to the ED with concern for high blood pressure.  She states that earlier today she had some mild lightheadedness and a mild headache.  She checked her blood pressure at home and noted it to be high, but cannot remember what it was.  Shortly after her arrival here in the ED, all of her symptoms resolved. She states her doctor is actively working to adjust her blood pressure medications. She denies syncope, nausea/vomiting, chest pain, shortness of breath, abdominal pain, difficulty urinating, back pain, neuro deficits, vision changes, or any other complaints.   Past Medical History:  Diagnosis Date  . Diabetes mellitus without complication ([employer])   . High cholesterol   . Hypertension     There are no active problems to display for this patient.   Past Surgical History:  Procedure Laterality Date  . LEG SURGERY       OB History   No obstetric history on file.      Home Medications    Prior to Admission medications   Medication Sig Start Date End Date Taking? Authorizing Provider  carvedilol (COREG) 6.25 MG tablet Take 6.25 mg by mouth 2 (two) times daily with a meal.   Yes [provider]  linagliptin (TRADJENTA) 5 MG TABS tablet Take 5 mg by mouth daily.   Yes [provider]  spironolactone (ALDACTONE) 25 MG tablet Take 12.5 mg by mouth daily.    Yes [provider]  acetaminophen (TYLENOL) 500 MG tablet Take 2 tablets (1,000 mg total) by mouth every 6 (six) hours as needed. 05/22/18   Charlesetta Shanks, MD  aspirin 81 MG tablet Take 81 mg by mouth daily.    [provider]  hydrALAZINE  (APRESOLINE) 25 MG tablet Take 1 tablet (25 mg total) by mouth 3 (three) times daily. 05/22/18   Charlesetta Shanks, MD  meclizine (ANTIVERT) 25 MG tablet Take 1 tablet (25 mg total) by mouth 3 (three) times daily as needed. 06/23/13   Calvert Cantor, MD  NIFEdipine (PROCARDIA PO) Take 90 mg by mouth daily.     [provider]  OMEPRAZOLE PO Take 40 mg by mouth.     [provider]  PRAVASTATIN SODIUM PO Take 80 mg by mouth.     [provider]  SitaGLIPtin Phosphate (JANUVIA PO) Take by mouth.    [provider]    Family History History reviewed. No pertinent family history.  Social History Social History   Tobacco Use  . Smoking status: Never Smoker  . Smokeless tobacco: Never Used  Substance Use Topics  . Alcohol use: Yes  . Drug use: Not on file     Allergies   Ace inhibitors, Augmentin [amoxicillin-pot clavulanate], Benicar [olmesartan], and Ketek [telithromycin]   Review of Systems Review of Systems  Constitutional: Negative for diaphoresis and fever.  Respiratory: Negative for cough and shortness of breath.   Cardiovascular: Negative for chest pain and leg swelling.  Gastrointestinal: Negative for abdominal pain, nausea and vomiting.  Genitourinary: Negative for difficulty urinating.  Musculoskeletal: Negative for back pain.  Neurological: Positive for light-headedness (resolved) and headaches (resolved).  All other systems reviewed and are negative.    Physical Exam Updated Vital Signs BP (!) 160/58 (BP Location: Right Arm)   Pulse (!) 57   Temp 97.9 F (36.6 C) (Oral)   Resp 16   Ht 5\' 4"  (1.626 m)   Wt 77.1 kg   SpO2 99%   BMI 29.18 kg/m   Physical Exam Vitals signs and nursing note reviewed.  Constitutional:      General: She is not in acute distress.    Appearance: She is well-developed. She is not diaphoretic.  HENT:     Head: Normocephalic and atraumatic.     Mouth/Throat:     Mouth: Mucous membranes are moist.      Pharynx: Oropharynx is clear.  Eyes:     Conjunctiva/sclera: Conjunctivae normal.  Neck:     Musculoskeletal: Neck supple.  Cardiovascular:     Rate and Rhythm: Normal rate and regular rhythm.     Pulses: Normal pulses.          Radial pulses are 2+ on the right side and 2+ on the left side.       Posterior tibial pulses are 2+ on the right side and 2+ on the left side.     Heart sounds: Normal heart sounds.     Comments: Tactile temperature in the extremities appropriate and equal bilaterally. Pulmonary:     Effort: Pulmonary effort is normal. No respiratory distress.     Breath sounds: Normal breath sounds.  Abdominal:     Palpations: Abdomen is soft.     Tenderness: There is no abdominal tenderness. There is no guarding.  Musculoskeletal:     Right lower leg: No edema.     Left lower leg: No edema.  Lymphadenopathy:     Cervical: No cervical adenopathy.  Skin:    General: Skin is warm and dry.  Neurological:     Mental Status: She is alert.     Comments: Sensation grossly intact to light touch in the extremities.  Grip strengths equal bilaterally.  Strength 5/5 in all extremities. No gait disturbance. Coordination intact. Cranial nerves III-XII grossly intact. No facial droop.   Psychiatric:        Mood and Affect: Mood and affect normal.        Speech: Speech normal.        Behavior: Behavior normal.      ED Treatments / Results  Labs (all labs ordered are listed, but only abnormal results are displayed) Labs Reviewed - No data to display  EKG None  Radiology No results found.  Procedures Procedures (including critical care time)  Medications Ordered in ED Medications - No data to display   Initial Impression / Assessment and Plan / ED Course  I have reviewed the triage vital signs and the nursing notes.  Pertinent labs & imaging results that were available during my care of the patient were reviewed by me and considered in my medical decision making  (see chart for details).        Patient presents with concerns over her blood pressure.  She does not want any testing performed.  Her symptoms resolved shortly after arriving in the ED and did not recur.  Her blood pressure also improved without intervention.  She will follow-up with her PCP.  Strict return precautions discussed.  Patient voices understanding of these instructions, accepts the plan, and is comfortable with discharge.   Findings and plan of care discussed with Antony Blackbird, MD.  Vitals:   07/03/19 1619 07/03/19 1744 07/03/19 1949  BP:  (!) 193/74 (!) 160/58  Pulse:   (!) 57  Resp:   16  Temp:   97.9 F (36.6 C)  TempSrc:   Oral  SpO2:   99%  Weight: 77.1 kg    Height: 5\' 4"  (1.626 m)       Final Clinical Impressions(s) / ED Diagnoses   Final diagnoses:  Hypertension, unspecified type    ED Discharge Orders    None       Layla Maw 07/03/19 2222    Tegeler, Gwenyth Allegra, MD 07/04/19 [phone]

## 2019-07-03 NOTE — ED Triage Notes (Signed)
Pt c/o increased BP and h/a x 1 day , BP meds being adjusted by PMD

## 2019-07-03 NOTE — Discharge Instructions (Addendum)
Please follow-up with your primary care provider on this matter. Return to the emergency department for chest pain, severe headache, vision changes, shortness of breath, weakness, numbness, passing out, or any other major concerns.

## 2020-03-28 ENCOUNTER — Other Ambulatory Visit: Payer: Self-pay

## 2020-03-28 ENCOUNTER — Emergency Department (HOSPITAL_BASED_OUTPATIENT_CLINIC_OR_DEPARTMENT_OTHER)
Admission: EM | Admit: 2020-03-28 | Discharge: 2020-03-28 | Disposition: A | Discharge status: Home / Self Care | Payer: Medicare HMO | Attending: Emergency Medicine | Admitting: Emergency Medicine

## 2020-03-28 ENCOUNTER — Encounter (HOSPITAL_BASED_OUTPATIENT_CLINIC_OR_DEPARTMENT_OTHER): Payer: Self-pay

## 2020-03-28 DIAGNOSIS — Z7984 Long term (current) use of oral hypoglycemic drugs: Secondary | ICD-10-CM | POA: Insufficient documentation

## 2020-03-28 DIAGNOSIS — I10 Essential (primary) hypertension: Secondary | ICD-10-CM | POA: Insufficient documentation

## 2020-03-28 DIAGNOSIS — E119 Type 2 diabetes mellitus without complications: Secondary | ICD-10-CM | POA: Insufficient documentation

## 2020-03-28 DIAGNOSIS — Z79899 Other long term (current) drug therapy: Secondary | ICD-10-CM | POA: Diagnosis not present

## 2020-03-28 DIAGNOSIS — R220 Localized swelling, mass and lump, head: Secondary | ICD-10-CM | POA: Diagnosis present

## 2020-03-28 DIAGNOSIS — T783XXA Angioneurotic edema, initial encounter: Secondary | ICD-10-CM | POA: Insufficient documentation

## 2020-03-28 DIAGNOSIS — Z7982 Long term (current) use of aspirin: Secondary | ICD-10-CM | POA: Insufficient documentation

## 2020-03-28 LAB — CBC WITH DIFFERENTIAL/PLATELET
Abs Immature Granulocytes: 0.02 10*3/uL (ref 0.00–0.07)
Basophils Absolute: 0 10*3/uL (ref 0.0–0.1)
Basophils Relative: 1 %
Eosinophils Absolute: 0.1 10*3/uL (ref 0.0–0.5)
Eosinophils Relative: 1 %
HCT: 39.7 % (ref 36.0–46.0)
Hemoglobin: 13.6 g/dL (ref 12.0–15.0)
Immature Granulocytes: 0 %
Lymphocytes Relative: 43 %
Lymphs Abs: 3.6 10*3/uL (ref 0.7–4.0)
MCH: 31.3 pg (ref 26.0–34.0)
MCHC: 34.3 g/dL (ref 30.0–36.0)
MCV: 91.3 fL (ref 80.0–100.0)
Monocytes Absolute: 0.6 10*3/uL (ref 0.1–1.0)
Monocytes Relative: 8 %
Neutro Abs: 4 10*3/uL (ref 1.7–7.7)
Neutrophils Relative %: 47 %
Platelets: 296 10*3/uL (ref 150–400)
RBC: 4.35 MIL/uL (ref 3.87–5.11)
RDW: 13.5 % (ref 11.5–15.5)
WBC: 8.4 10*3/uL (ref 4.0–10.5)
nRBC: 0 % (ref 0.0–0.2)

## 2020-03-28 LAB — COMPREHENSIVE METABOLIC PANEL
ALT: 26 U/L (ref 0–44)
AST: 24 U/L (ref 15–41)
Albumin: 4 g/dL (ref 3.5–5.0)
Alkaline Phosphatase: 53 U/L (ref 38–126)
Anion gap: 14 (ref 5–15)
BUN: 26 mg/dL — ABNORMAL HIGH (ref 8–23)
CO2: 25 mmol/L (ref 22–32)
Calcium: 9.3 mg/dL (ref 8.9–10.3)
Chloride: 98 mmol/L (ref 98–111)
Creatinine, Ser: 1.56 mg/dL — ABNORMAL HIGH (ref 0.44–1.00)
GFR calc Af Amer: 36 mL/min — ABNORMAL LOW (ref >60)
GFR calc non Af Amer: 31 mL/min — ABNORMAL LOW (ref >60)
Glucose, Bld: 149 mg/dL — ABNORMAL HIGH (ref 70–99)
Potassium: 3 mmol/L — ABNORMAL LOW (ref 3.5–5.1)
Sodium: 137 mmol/L (ref 135–145)
Total Bilirubin: 0.6 mg/dL (ref 0.3–1.2)
Total Protein: 7.5 g/dL (ref 6.5–8.1)

## 2020-03-28 MED ORDER — DIPHENHYDRAMINE HCL 50 MG/ML IJ SOLN
25.0000 mg | Freq: Once | INTRAMUSCULAR | Status: AC
  Administered 2020-03-28: 25 mg via INTRAVENOUS
  Filled 2020-03-28: qty 1

## 2020-03-28 MED ORDER — PREDNISONE 10 MG PO TABS
40.0000 mg | ORAL_TABLET | Freq: Every day | ORAL | 0 refills | Status: AC
Start: 2020-03-28 — End: 2020-03-31

## 2020-03-28 MED ORDER — FAMOTIDINE IN NACL 20-0.9 MG/50ML-% IV SOLN
20.0000 mg | Freq: Once | INTRAVENOUS | Status: AC
  Administered 2020-03-28: 20 mg via INTRAVENOUS
  Filled 2020-03-28: qty 50

## 2020-03-28 MED ORDER — METHYLPREDNISOLONE SODIUM SUCC 125 MG IJ SOLR
125.0000 mg | Freq: Once | INTRAMUSCULAR | Status: AC
  Administered 2020-03-28: 125 mg via INTRAVENOUS
  Filled 2020-03-28: qty 2

## 2020-03-28 MED ORDER — EPINEPHRINE 0.3 MG/0.3ML IJ SOAJ
0.3000 mg | Freq: Once | INTRAMUSCULAR | Status: AC
  Administered 2020-03-28: 0.3 mg via INTRAMUSCULAR
  Filled 2020-03-28: qty 0.3

## 2020-03-28 NOTE — ED Triage Notes (Signed)
Pt c/o tongue swelling that started at 1400 today. Pt states only difference from her usual routine is that she had a [employer]. Pt does not normally eat strawberries. Pt's airway is patent with no difficulty breathing. Pt took 12.5mg  of diphenhydramine at 14:45 and again at 17:15.

## 2020-03-28 NOTE — ED Provider Notes (Signed)
[facility] EMERGENCY DEPARTMENT Provider Note   CSN: [id number] Arrival date & time: 03/28/20  1806     History Chief Complaint  Patient presents with  . Oral Swelling    Angie Barrett is a 79 y.o. female.  HPI     79 year old female with history of diabetes, hypertension and hyperlipidemia, as well as history of angioedema per prior notes, presents with concern for tongue swelling.  Reports that symptoms began around 2 PM, and have not changed.  Ports falling to the left side of the tongue.  Denies any sensation of throat swelling, no drooling, no dysphagia, no dyspnea, no change in voice.  Denies rash, nausea, vomiting, abdominal pain or diarrhea.  She notes having similar episodes like this in the past, but is unsure of the cause.  Per prior notes, she had episodes thought to be associated with ACE inhibitor's and ARB's, however most recently had oral swelling while off of these medications and was thought to have been a potential allergic reaction to clotrimazole.  She denies any other concerns.  Took 12 point fill 5 mg of Benadryl 4 hours ago, and took another 12.5 mg an hour ago. Occurred after having strawberry ensure. No new medications, or other changes.   Past Medical History:  Diagnosis Date  . Diabetes mellitus without complication ([place])   . High cholesterol   . Hypertension     There are no problems to display for this patient.   Past Surgical History:  Procedure Laterality Date  . LEG SURGERY       OB History   No obstetric history on file.     No family history on file.  Social History   Tobacco Use  . Smoking status: Never Smoker  . Smokeless tobacco: Never Used  Substance Use Topics  . Alcohol use: Not Currently  . Drug use: Never    Home Medications Prior to Admission medications   Medication Sig Start Date End Date Taking? Authorizing Provider  acetaminophen (TYLENOL) 500 MG tablet Take 2 tablets (1,000 mg total) by mouth every 6  (six) hours as needed. 05/22/18   Charlesetta Shanks, MD  aspirin 81 MG tablet Take 81 mg by mouth daily.    [provider]  carvedilol (COREG) 6.25 MG tablet Take 6.25 mg by mouth 2 (two) times daily with a meal.    [provider]  hydrALAZINE (APRESOLINE) 25 MG tablet Take 1 tablet (25 mg total) by mouth 3 (three) times daily. 05/22/18   Charlesetta Shanks, MD  linagliptin (TRADJENTA) 5 MG TABS tablet Take 5 mg by mouth daily.    [provider]  meclizine (ANTIVERT) 25 MG tablet Take 1 tablet (25 mg total) by mouth 3 (three) times daily as needed. 06/23/13   Truddie Hidden, MD  NIFEdipine (PROCARDIA PO) Take 90 mg by mouth daily.     [provider]  OMEPRAZOLE PO Take 40 mg by mouth.     [provider]  PRAVASTATIN SODIUM PO Take 80 mg by mouth.     [provider]  predniSONE (DELTASONE) 10 MG tablet Take 4 tablets (40 mg total) by mouth daily for 3 days. 03/28/20 03/31/20  Gareth Morgan, MD  SitaGLIPtin Phosphate (JANUVIA PO) Take by mouth.    [provider]  spironolactone (ALDACTONE) 25 MG tablet Take 12.5 mg by mouth daily.     [provider]    Allergies    Amoxicillin-pot clavulanate, Ciprofloxacin, Clotrimazole, Nystatin, Olmesartan, Hydrochlorothiazide, Metformin,  Spironolactone, Telithromycin, and Ace inhibitors  Review of Systems   Review of Systems  Constitutional: Negative for fever.  HENT: Positive for facial swelling (tongue swelling). Negative for drooling, sore throat, trouble swallowing and voice change.   Eyes: Negative for visual disturbance.  Respiratory: Negative for cough and shortness of breath.   Cardiovascular: Negative for chest pain.  Gastrointestinal: Negative for abdominal pain, diarrhea, nausea and vomiting.  Genitourinary: Negative for difficulty urinating.  Musculoskeletal: Negative for neck pain.  Skin: Negative for rash.  Neurological: Negative for syncope and headaches.     Physical Exam Updated Vital Signs BP (!) 148/67   Pulse 70   Resp 20   Ht 5\' 4"  (1.626 m)   Wt 73.5 kg   SpO2 93%   BMI 27.81 kg/m   Physical Exam Vitals and nursing note reviewed.  Constitutional:      General: She is not in acute distress.    Appearance: She is well-developed. She is not diaphoretic.  HENT:     Head: Normocephalic and atraumatic.     Mouth/Throat:     Pharynx: No oropharyngeal exudate or posterior oropharyngeal erythema.     Comments: Left side of tongue with swelling No pharyngeal swelling Eyes:     Conjunctiva/sclera: Conjunctivae normal.  Cardiovascular:     Rate and Rhythm: Normal rate and regular rhythm.     Heart sounds: Normal heart sounds. No murmur. No friction rub. No gallop.   Pulmonary:     Effort: Pulmonary effort is normal. No respiratory distress.     Breath sounds: Normal breath sounds. No stridor. No wheezing or rales.  Abdominal:     General: There is no distension.     Palpations: Abdomen is soft.     Tenderness: There is no abdominal tenderness. There is no guarding.  Musculoskeletal:        General: No tenderness.     Cervical back: Normal range of motion.  Skin:    General: Skin is warm and dry.     Findings: No erythema or rash.  Neurological:     Mental Status: She is alert and oriented to person, place, and time.     ED Results / Procedures / Treatments   Labs (all labs ordered are listed, but only abnormal results are displayed) Labs Reviewed  COMPREHENSIVE METABOLIC PANEL - Abnormal; Notable for the following components:      Result Value   Potassium 3.0 (*)    Glucose, Bld 149 (*)    BUN 26 (*)    Creatinine, Ser 1.56 (*)    GFR calc non Af Amer 31 (*)    GFR calc Af Amer 36 (*)    All other components within normal limits  CBC WITH DIFFERENTIAL/PLATELET    EKG EKG Interpretation  Date/Time:  Thursday Mar 28 2020 18:17:02 EDT Ventricular Rate:  82 PR Interval:    QRS Duration: 98 QT  Interval:  442 QTC Calculation: 517 R Axis:   16 Text Interpretation: Sinus rhythm Borderline repolarization abnormality Prolonged QT interval Baseline wander in lead(s) V3 No significant change since last tracing Confirmed by Calvert Cantor [phone]) on 03/29/2020 9:26:42 AM   Radiology No results found.  Procedures Procedures (including critical care time)  Medications Ordered in ED Medications  methylPREDNISolone sodium succinate (SOLU-MEDROL) 125 mg/2 mL injection 125 mg (125 mg Intravenous Given 03/28/20 1853)  diphenhydrAMINE (BENADRYL) injection 25 mg (25 mg Intravenous Given 03/28/20 1853)  famotidine (PEPCID) IVPB 20 mg premix ( Intravenous Stopped  03/28/20 1946)  EPINEPHrine (EPI-PEN) injection 0.3 mg (0.3 mg Intramuscular Given 03/28/20 1844)    ED Course  I have reviewed the triage vital signs and the nursing notes.  Pertinent labs & imaging results that were available during my care of the patient were reviewed by me and considered in my medical decision making (see chart for details).    MDM Rules/Calculators/A&P                      79 year old female with history of diabetes, hypertension and hyperlipidemia, as well as history of angioedema per prior notes, presents with concern for tongue swelling.   Exam on arrival is consistent with left-sided tongue swelling/angioedema without signs of oral pharyngeal edema or lip swelling.  No dysphonia, dysphagia, drooling or stridor.  Unclear etiology at this time, no other symptoms to suggest anaphylaxis, however given edema present without clear cause, gave IM epinephrine, IV Solu-Medrol, IV Pepcid and Benadryl.  Her tongue swelling slowly improved after therapies in the emergency department.  After 4-1/2 hours, her tongue is nearly back to normal.  Given improvement over the observation.  No sign of throat swelling, feel she is stable for outpatient management with strict return precautions. Recommend follow up with allergist  given recurrent episodes with unclear trigger.    Final Clinical Impression(s) / ED Diagnoses Final diagnoses:  Angioedema, initial encounter    Rx / DC Orders ED Discharge Orders         Ordered    predniSONE (DELTASONE) 10 MG tablet  Daily     03/28/20 2251           Gareth Morgan, MD 03/29/20 [phone]

## 2020-06-28 ENCOUNTER — Other Ambulatory Visit: Payer: Self-pay

## 2020-06-28 ENCOUNTER — Encounter (HOSPITAL_BASED_OUTPATIENT_CLINIC_OR_DEPARTMENT_OTHER): Payer: Self-pay

## 2020-06-28 ENCOUNTER — Emergency Department (HOSPITAL_BASED_OUTPATIENT_CLINIC_OR_DEPARTMENT_OTHER)
Admission: EM | Admit: 2020-06-28 | Discharge: 2020-06-28 | Disposition: A | Discharge status: Home / Self Care | Payer: Medicare HMO | Attending: Emergency Medicine | Admitting: Emergency Medicine

## 2020-06-28 ENCOUNTER — Emergency Department (HOSPITAL_BASED_OUTPATIENT_CLINIC_OR_DEPARTMENT_OTHER): Payer: Medicare HMO

## 2020-06-28 DIAGNOSIS — Z7982 Long term (current) use of aspirin: Secondary | ICD-10-CM | POA: Diagnosis not present

## 2020-06-28 DIAGNOSIS — Z79899 Other long term (current) drug therapy: Secondary | ICD-10-CM | POA: Insufficient documentation

## 2020-06-28 DIAGNOSIS — E119 Type 2 diabetes mellitus without complications: Secondary | ICD-10-CM | POA: Diagnosis not present

## 2020-06-28 DIAGNOSIS — I10 Essential (primary) hypertension: Secondary | ICD-10-CM | POA: Diagnosis not present

## 2020-06-28 DIAGNOSIS — M791 Myalgia, unspecified site: Secondary | ICD-10-CM | POA: Diagnosis not present

## 2020-06-28 DIAGNOSIS — M79605 Pain in left leg: Secondary | ICD-10-CM | POA: Insufficient documentation

## 2020-06-28 NOTE — ED Triage Notes (Signed)
Pt c/o pain to left LE x 3 days-states she was sent by [facility]-chart reads need for DVT study due to elevated ddimer-NAD-steady gait

## 2020-06-29 NOTE — ED Provider Notes (Signed)
[facility] EMERGENCY DEPARTMENT Provider Note   CSN: [id number] Arrival date & time: 06/28/20  1937     History Chief Complaint  Patient presents with  . Leg Pain    Angie Barrett is a 79 y.o. female.  HPI   79yo female with history of DM, htn, hlpd presents with concern for left calf pain.  The pain ismild-moderate and improved with tylenol.  Has been present for about 3 days. No associated swelling, chest pain, dyspnea, or fevers. Hit front of tibia on car previously, no redness. No other trauma to the area.  Past Medical History:  Diagnosis Date  . Diabetes mellitus without complication ([place])   . High cholesterol   . Hypertension     There are no problems to display for this patient.   Past Surgical History:  Procedure Laterality Date  . LEG SURGERY       OB History   No obstetric history on file.     No family history on file.  Social History   Tobacco Use  . Smoking status: Never Smoker  . Smokeless tobacco: Never Used  Vaping Use  . Vaping Use: Never used  Substance Use Topics  . Alcohol use: Not Currently  . Drug use: Never    Home Medications Prior to Admission medications   Medication Sig Start Date End Date Taking? Authorizing Provider  acetaminophen (TYLENOL) 500 MG tablet Take 2 tablets (1,000 mg total) by mouth every 6 (six) hours as needed. 05/22/18   Charlesetta Shanks, MD  aspirin 81 MG tablet Take 81 mg by mouth daily.    [provider]  carvedilol (COREG) 6.25 MG tablet Take 6.25 mg by mouth 2 (two) times daily with a meal.    [provider]  hydrALAZINE (APRESOLINE) 25 MG tablet Take 1 tablet (25 mg total) by mouth 3 (three) times daily. 05/22/18   Charlesetta Shanks, MD  linagliptin (TRADJENTA) 5 MG TABS tablet Take 5 mg by mouth daily.    [provider]  meclizine (ANTIVERT) 25 MG tablet Take 1 tablet (25 mg total) by mouth 3 (three) times daily as needed. 06/23/13   Truddie Hidden, MD  NIFEdipine  (PROCARDIA PO) Take 90 mg by mouth daily.     [provider]  OMEPRAZOLE PO Take 40 mg by mouth.     [provider]  PRAVASTATIN SODIUM PO Take 80 mg by mouth.     [provider]  SitaGLIPtin Phosphate (JANUVIA PO) Take by mouth.    [provider]  spironolactone (ALDACTONE) 25 MG tablet Take 12.5 mg by mouth daily.     [provider]    Allergies    Amoxicillin-pot clavulanate, Ciprofloxacin, Clotrimazole, Nystatin, Olmesartan, Hydrochlorothiazide, Metformin, Spironolactone, Telithromycin, and Ace inhibitors  Review of Systems   Review of Systems  Constitutional: Negative for fever.  Respiratory: Negative for cough and shortness of breath.   Cardiovascular: Negative for chest pain and leg swelling.  Gastrointestinal: Negative for vomiting.  Musculoskeletal: Positive for myalgias.  Skin: Negative for rash.  Neurological: Negative for headaches.    Physical Exam Updated Vital Signs BP (!) 152/64 (BP Location: Right Arm)   Pulse 65   Temp 98.5 F (36.9 C) (Oral)   Resp 16   Ht 5\' 4"  (1.626 m)   Wt 72.6 kg   SpO2 99%   BMI 27.46 kg/m   Physical Exam Vitals and nursing note reviewed.  Constitutional:      General: She is  not in acute distress.    Appearance: Normal appearance. She is not ill-appearing, toxic-appearing or diaphoretic.  HENT:     Head: Normocephalic.  Eyes:     Conjunctiva/sclera: Conjunctivae normal.  Cardiovascular:     Rate and Rhythm: Normal rate and regular rhythm.     Pulses: Normal pulses.  Pulmonary:     Effort: Pulmonary effort is normal. No respiratory distress.  Musculoskeletal:        General: Tenderness (left calf) present. No deformity or signs of injury.     Cervical back: No rigidity.     Comments: No erythema, no fluctuance Small subcentimeter healing wound to anterior tibia without surrounding erythema. 2+DP pulses  Skin:    General: Skin is warm and dry.     Coloration: Skin is not  jaundiced or pale.  Neurological:     General: No focal deficit present.     Mental Status: She is alert and oriented to person, place, and time.     ED Results / Procedures / Treatments   Labs (all labs ordered are listed, but only abnormal results are displayed) Labs Reviewed - No data to display  EKG None  Radiology US Venous Img Lower Unilateral Left  Result Date: 06/28/2020 CLINICAL DATA:  Left calf pain EXAM: LEFT LOWER EXTREMITY VENOUS DOPPLER ULTRASOUND TECHNIQUE: Gray-scale sonography with compression, as well as color and duplex ultrasound, were performed to evaluate the deep venous system(s) from the level of the common femoral vein through the popliteal and proximal calf veins. COMPARISON:  None. FINDINGS: VENOUS Normal compressibility of the common femoral, superficial femoral, and popliteal veins, as well as the visualized calf veins. Visualized portions of profunda femoral vein and great saphenous vein unremarkable. No filling defects to suggest DVT on grayscale or color Doppler imaging. Doppler waveforms show normal direction of venous flow, normal respiratory plasticity and response to augmentation. Limited views of the contralateral common femoral vein are unremarkable. OTHER None Limitations: Limited views of the calf veins due to edema. IMPRESSION: No evidence of left lower extremity DVT. Electronically Signed   By: Rolm Baptise M.D.   On: 06/28/2020 21:17    Procedures Procedures (including critical care time)  Medications Ordered in ED Medications - No data to display  ED Course  I have reviewed the triage vital signs and the nursing notes.  Pertinent labs & imaging results that were available during my care of the patient were reviewed by me and considered in my medical decision making (see chart for details).    MDM Rules/Calculators/A&P                           79yo female with history of DM, htn, hlpd presents with concern for left calf pain.   No sign  of acute arterial thrombus, normal pulses, No sign of infection. DVT study negative. Possible muscular strain. Recommend continued use of tylenol for symptomatic treatemnt. Patient discharged in stable condition with understanding of reasons to return.    Final Clinical Impression(s) / ED Diagnoses Final diagnoses:  Left leg pain    Rx / DC Orders ED Discharge Orders    None       Gareth Morgan, MD 06/29/20 1042

## 2020-08-25 ENCOUNTER — Encounter (HOSPITAL_BASED_OUTPATIENT_CLINIC_OR_DEPARTMENT_OTHER): Payer: Self-pay | Admitting: Emergency Medicine

## 2020-08-25 ENCOUNTER — Other Ambulatory Visit: Payer: Self-pay

## 2020-08-25 ENCOUNTER — Observation Stay (HOSPITAL_BASED_OUTPATIENT_CLINIC_OR_DEPARTMENT_OTHER)
Admission: EM | Admit: 2020-08-25 | Discharge: 2020-08-27 | Disposition: A | Discharge status: Home / Self Care | Payer: Medicare HMO | Attending: Emergency Medicine | Admitting: Emergency Medicine

## 2020-08-25 ENCOUNTER — Emergency Department (HOSPITAL_BASED_OUTPATIENT_CLINIC_OR_DEPARTMENT_OTHER): Payer: Medicare HMO

## 2020-08-25 DIAGNOSIS — I10 Essential (primary) hypertension: Secondary | ICD-10-CM | POA: Insufficient documentation

## 2020-08-25 DIAGNOSIS — Z7982 Long term (current) use of aspirin: Secondary | ICD-10-CM | POA: Diagnosis not present

## 2020-08-25 DIAGNOSIS — T783XXA Angioneurotic edema, initial encounter: Secondary | ICD-10-CM | POA: Diagnosis not present

## 2020-08-25 DIAGNOSIS — Z7984 Long term (current) use of oral hypoglycemic drugs: Secondary | ICD-10-CM | POA: Diagnosis not present

## 2020-08-25 DIAGNOSIS — Z20822 Contact with and (suspected) exposure to covid-19: Secondary | ICD-10-CM | POA: Insufficient documentation

## 2020-08-25 DIAGNOSIS — E119 Type 2 diabetes mellitus without complications: Secondary | ICD-10-CM | POA: Diagnosis not present

## 2020-08-25 DIAGNOSIS — T7840XA Allergy, unspecified, initial encounter: Secondary | ICD-10-CM | POA: Diagnosis present

## 2020-08-25 DIAGNOSIS — Z79899 Other long term (current) drug therapy: Secondary | ICD-10-CM | POA: Diagnosis not present

## 2020-08-25 DIAGNOSIS — I169 Hypertensive crisis, unspecified: Secondary | ICD-10-CM | POA: Insufficient documentation

## 2020-08-25 LAB — TROPONIN I (HIGH SENSITIVITY)
Troponin I (High Sensitivity): 8 ng/L (ref <18)
Troponin I (High Sensitivity): 7 ng/L (ref <18)

## 2020-08-25 LAB — RESPIRATORY PANEL BY RT PCR (FLU A&B, COVID)
Influenza A by PCR: NEGATIVE
Influenza B by PCR: NEGATIVE
SARS Coronavirus 2 by RT PCR: NEGATIVE

## 2020-08-25 LAB — BASIC METABOLIC PANEL
Anion gap: 12 (ref 5–15)
BUN: 32 mg/dL — ABNORMAL HIGH (ref 8–23)
CO2: 25 mmol/L (ref 22–32)
Calcium: 9.2 mg/dL (ref 8.9–10.3)
Chloride: 102 mmol/L (ref 98–111)
Creatinine, Ser: 1.66 mg/dL — ABNORMAL HIGH (ref 0.44–1.00)
GFR, Estimated: 29 mL/min — ABNORMAL LOW (ref >60)
Glucose, Bld: 129 mg/dL — ABNORMAL HIGH (ref 70–99)
Potassium: 3.4 mmol/L — ABNORMAL LOW (ref 3.5–5.1)
Sodium: 139 mmol/L (ref 135–145)

## 2020-08-25 LAB — CBC WITH DIFFERENTIAL/PLATELET
Abs Immature Granulocytes: 0.03 10*3/uL (ref 0.00–0.07)
Basophils Absolute: 0 10*3/uL (ref 0.0–0.1)
Basophils Relative: 0 %
Eosinophils Absolute: 0.1 10*3/uL (ref 0.0–0.5)
Eosinophils Relative: 1 %
HCT: 38.6 % (ref 36.0–46.0)
Hemoglobin: 13 g/dL (ref 12.0–15.0)
Immature Granulocytes: 0 %
Lymphocytes Relative: 41 %
Lymphs Abs: 3.7 10*3/uL (ref 0.7–4.0)
MCH: 31.2 pg (ref 26.0–34.0)
MCHC: 33.7 g/dL (ref 30.0–36.0)
MCV: 92.6 fL (ref 80.0–100.0)
Monocytes Absolute: 0.9 10*3/uL (ref 0.1–1.0)
Monocytes Relative: 10 %
Neutro Abs: 4.2 10*3/uL (ref 1.7–7.7)
Neutrophils Relative %: 48 %
Platelets: 265 10*3/uL (ref 150–400)
RBC: 4.17 MIL/uL (ref 3.87–5.11)
RDW: 13.8 % (ref 11.5–15.5)
WBC: 8.9 10*3/uL (ref 4.0–10.5)
nRBC: 0 % (ref 0.0–0.2)

## 2020-08-25 LAB — GLUCOSE, CAPILLARY
Glucose-Capillary: 100 mg/dL — ABNORMAL HIGH (ref 70–99)
Glucose-Capillary: 180 mg/dL — ABNORMAL HIGH (ref 70–99)
Glucose-Capillary: 190 mg/dL — ABNORMAL HIGH (ref 70–99)

## 2020-08-25 LAB — CBG MONITORING, ED: Glucose-Capillary: 194 mg/dL — ABNORMAL HIGH (ref 70–99)

## 2020-08-25 MED ORDER — ENOXAPARIN SODIUM 30 MG/0.3ML ~~LOC~~ SOLN
30.0000 mg | SUBCUTANEOUS | Status: DC
  Administered 2020-08-25 – 2020-08-27 (×3): 30 mg via SUBCUTANEOUS
  Filled 2020-08-25 (×3): qty 0.3

## 2020-08-25 MED ORDER — FAMOTIDINE IN NACL 20-0.9 MG/50ML-% IV SOLN: 20.0000 mg | Freq: Two times a day (BID) | INTRAVENOUS | Status: DC | PRN

## 2020-08-25 MED ORDER — DIPHENHYDRAMINE HCL 50 MG/ML IJ SOLN
25.0000 mg | Freq: Once | INTRAMUSCULAR | Status: AC
  Administered 2020-08-25: 25 mg via INTRAVENOUS
  Filled 2020-08-25: qty 1

## 2020-08-25 MED ORDER — METHYLPREDNISOLONE SODIUM SUCC 125 MG IJ SOLR: 125.0000 mg | Freq: Four times a day (QID) | INTRAMUSCULAR | Status: DC | PRN

## 2020-08-25 MED ORDER — INSULIN ASPART 100 UNIT/ML ~~LOC~~ SOLN
0.0000 [IU] | SUBCUTANEOUS | Status: DC
  Administered 2020-08-25: 4 [IU] via SUBCUTANEOUS
  Administered 2020-08-27: 2 [IU] via SUBCUTANEOUS

## 2020-08-25 MED ORDER — DIPHENHYDRAMINE HCL 50 MG/ML IJ SOLN: 12.5000 mg | Freq: Four times a day (QID) | INTRAMUSCULAR | Status: DC | PRN

## 2020-08-25 MED ORDER — HYDRALAZINE HCL 20 MG/ML IJ SOLN
5.0000 mg | Freq: Once | INTRAMUSCULAR | Status: AC
  Administered 2020-08-25: 5 mg via INTRAVENOUS
  Filled 2020-08-25: qty 1

## 2020-08-25 MED ORDER — NICARDIPINE HCL IN NACL 20-0.86 MG/200ML-% IV SOLN
3.0000 mg/h | INTRAVENOUS | Status: DC
  Administered 2020-08-25: 5 mg/h via INTRAVENOUS
  Filled 2020-08-25: qty 200

## 2020-08-25 MED ORDER — HYDRALAZINE HCL 20 MG/ML IJ SOLN
5.0000 mg | Freq: Four times a day (QID) | INTRAMUSCULAR | Status: DC | PRN
  Administered 2020-08-26 – 2020-08-27 (×4): 5 mg via INTRAVENOUS
  Filled 2020-08-25 (×4): qty 1

## 2020-08-25 MED ORDER — EPINEPHRINE 0.3 MG/0.3ML IJ SOAJ: 0.3000 mg | Freq: Once | INTRAMUSCULAR | Status: DC

## 2020-08-25 MED ORDER — HYDRALAZINE HCL 25 MG PO TABS
25.0000 mg | ORAL_TABLET | Freq: Three times a day (TID) | ORAL | Status: DC
  Administered 2020-08-25 – 2020-08-26 (×4): 25 mg via ORAL
  Filled 2020-08-25 (×5): qty 1

## 2020-08-25 MED ORDER — FAMOTIDINE IN NACL 20-0.9 MG/50ML-% IV SOLN
20.0000 mg | Freq: Once | INTRAVENOUS | Status: AC
  Administered 2020-08-25: 20 mg via INTRAVENOUS
  Filled 2020-08-25: qty 50

## 2020-08-25 MED ORDER — ACETAMINOPHEN 80 MG PO CHEW
80.0000 mg | CHEWABLE_TABLET | Freq: Four times a day (QID) | ORAL | Status: DC | PRN
  Filled 2020-08-25: qty 1

## 2020-08-25 MED ORDER — EPINEPHRINE 0.3 MG/0.3ML IJ SOAJ
0.3000 mg | Freq: Once | INTRAMUSCULAR | Status: AC
  Administered 2020-08-25: 0.3 mg via INTRAMUSCULAR
  Filled 2020-08-25: qty 0.3

## 2020-08-25 MED ORDER — METHYLPREDNISOLONE SODIUM SUCC 125 MG IJ SOLR
125.0000 mg | Freq: Once | INTRAMUSCULAR | Status: AC
  Administered 2020-08-25: 125 mg via INTRAVENOUS
  Filled 2020-08-25: qty 2

## 2020-08-25 MED ORDER — LABETALOL HCL 5 MG/ML IV SOLN
10.0000 mg | INTRAVENOUS | Status: DC | PRN
  Administered 2020-08-25 – 2020-08-26 (×3): 10 mg via INTRAVENOUS
  Filled 2020-08-25 (×4): qty 4

## 2020-08-25 MED ORDER — INFLUENZA VAC A&B SA ADJ QUAD 0.5 ML IM PRSY
0.5000 mL | PREFILLED_SYRINGE | INTRAMUSCULAR | Status: DC
  Filled 2020-08-25: qty 0.5

## 2020-08-25 NOTE — ED Provider Notes (Addendum)
[facility] EMERGENCY DEPARTMENT Provider Note   CSN: [id number] Arrival date & time: 08/25/20  0135     History Chief Complaint  Patient presents with  . Allergic Reaction    Angie Barrett is a 79 y.o. female.  The history is provided by the patient and medical records. The history is limited by the condition of the patient.  Allergic Reaction Presenting symptoms: swelling   Swelling:    Onset quality:  Sudden   Duration:  1 hour   Timing:  Constant   Progression:  Unchanged   Chronicity:  Recurrent Severity:  Severe Prior allergic episodes:  Allergies to medications Context: medications   Context comment:  New osteoporosis medication  Relieved by:  Nothing Worsened by:  Nothing Ineffective treatments:  Antihistamines Hypertension This is a chronic problem. The current episode started more than 1 week ago. The problem occurs constantly. The problem has been gradually worsening. Pertinent negatives include no chest pain, no abdominal pain and no shortness of breath. Nothing aggravates the symptoms. Nothing relieves the symptoms. Treatments tried: home medication  The treatment provided no relief.  Patient with HTN and a h/o angioedema presents with angioedema and HTN.  Patient started a new medication for osteoporosis this month.  Tonight developed acute onset of tongue swelling.  She has not missed any doses of her BP medication.  No CP, No SOB.       Past Medical History:  Diagnosis Date  . Diabetes mellitus without complication ([place])   . High cholesterol   . Hypertension     There are no problems to display for this patient.   Past Surgical History:  Procedure Laterality Date  . LEG SURGERY       OB History   No obstetric history on file.     History reviewed. No pertinent family history.  Social History   Tobacco Use  . Smoking status: Never Smoker  . Smokeless tobacco: Never Used  Vaping Use  . Vaping Use: Never used  Substance Use  Topics  . Alcohol use: Not Currently  . Drug use: Never    Home Medications Prior to Admission medications   Medication Sig Start Date End Date Taking? Authorizing Provider  aspirin 81 MG tablet Take 81 mg by mouth daily.   Yes [provider]  calcium carbonate (OSCAL) 1500 (600 Ca) MG TABS tablet Take 600 mg of elemental calcium by mouth 2 (two) times daily with a meal.   Yes [provider]  cholecalciferol (VITAMIN D3) 25 MCG (1000 UNIT) tablet Take 2,000 Units by mouth daily.   Yes [provider]  conjugated estrogens (PREMARIN) vaginal cream Place 1 Applicatorful vaginally 2 (two) times a week.   Yes [provider]  fluticasone (FLONASE) 50 MCG/ACT nasal spray Place 2 sprays into both nostrils daily.   Yes [provider]  furosemide (LASIX) 20 MG tablet Take 20 mg by mouth 2 (two) times daily.   Yes [provider]  linagliptin (TRADJENTA) 5 MG TABS tablet Take 5 mg by mouth daily.   Yes [provider]  metoprolol succinate (TOPROL-XL) 25 MG 24 hr tablet Take 25 mg by mouth daily.   Yes [provider]  NIFEdipine (PROCARDIA PO) Take 90 mg by mouth daily.    Yes [provider]  omeprazole (PRILOSEC) 40 MG capsule Take 40 mg by mouth daily.   Yes [provider]  OMEPRAZOLE PO Take 40 mg by mouth.    Yes [provider]  pioglitazone (ACTOS) 15 MG tablet Take 15 mg by mouth daily.   Yes [provider]  PRAVASTATIN SODIUM PO Take 80 mg by mouth.    Yes [provider]  Teriparatide, Recombinant, 620 MCG/2.48ML SOPN Inject 620 mcg into the skin.   Yes [provider]  acetaminophen (TYLENOL) 500 MG tablet Take 2 tablets (1,000 mg total) by mouth every 6 (six) hours as needed. 05/22/18   Charlesetta Shanks, MD  carvedilol (COREG) 6.25 MG tablet Take 6.25 mg by mouth 2 (two) times daily with a meal.    [provider]  hydrALAZINE (APRESOLINE) 25 MG tablet  Take 1 tablet (25 mg total) by mouth 3 (three) times daily. 05/22/18   Charlesetta Shanks, MD  meclizine (ANTIVERT) 25 MG tablet Take 1 tablet (25 mg total) by mouth 3 (three) times daily as needed. 06/23/13   Truddie Hidden, MD  SitaGLIPtin Phosphate (JANUVIA PO) Take by mouth.    [provider]  spironolactone (ALDACTONE) 25 MG tablet Take 12.5 mg by mouth daily.     [provider]    Allergies    Amoxicillin-pot clavulanate, Ciprofloxacin, Clotrimazole, Nystatin, Olmesartan, Hydrochlorothiazide, Metformin, Spironolactone, Telithromycin, and Ace inhibitors  Review of Systems   Review of Systems  Unable to perform ROS: Acuity of condition  Constitutional: Negative for fever.  HENT: Negative for drooling.        Tongue swelling   Respiratory: Negative for shortness of breath.   Cardiovascular: Negative for chest pain.  Gastrointestinal: Negative for abdominal pain.  Neurological: Negative for facial asymmetry, weakness and numbness.    Physical Exam Updated Vital Signs BP (!) 164/59   Pulse 72   Temp (!) 97.5 F (36.4 C) (Oral)   Resp 12   Ht 5\' 4"  (1.626 m)   Wt 73.9 kg   SpO2 96%   BMI 27.98 kg/m   Physical Exam Vitals and nursing note reviewed.  Constitutional:      Appearance: Normal appearance. She is not diaphoretic.  HENT:     Head: Normocephalic and atraumatic.     Nose: Nose normal.     Mouth/Throat:     Mouth: Mucous membranes are moist.     Comments: Angioedema of the tongue  Eyes:     Conjunctiva/sclera: Conjunctivae normal.     Pupils: Pupils are equal, round, and reactive to light.  Cardiovascular:     Rate and Rhythm: Normal rate and regular rhythm.  Pulmonary:     Effort: Pulmonary effort is normal.     Breath sounds: Normal breath sounds.  Abdominal:     General: Abdomen is flat. Bowel sounds are normal.     Palpations: Abdomen is soft.     Tenderness: There is no abdominal tenderness. There is no guarding.  Musculoskeletal:         General: Normal range of motion.     Cervical back: Normal range of motion and neck supple. No tenderness.  Skin:    General: Skin is warm and dry.     Capillary Refill: Capillary refill takes less than 2 seconds.     Findings: No rash.  Neurological:     General: No focal deficit present.     Mental Status: She is alert.     Deep Tendon Reflexes: Reflexes normal.  Psychiatric:        Mood and Affect: Mood normal.     ED Results / Procedures / Treatments   Labs (all labs ordered are  listed, but only abnormal results are displayed) Results for orders placed or performed during the hospital encounter of 08/25/20  Respiratory Panel by RT PCR (Flu A&B, Covid) - Nasopharyngeal Swab   Specimen: Nasopharyngeal Swab  Result Value Ref Range   SARS Coronavirus 2 by RT PCR NEGATIVE NEGATIVE   Influenza A by PCR NEGATIVE NEGATIVE   Influenza B by PCR NEGATIVE NEGATIVE  CBC with Differential/Platelet  Result Value Ref Range   WBC 8.9 4.0 - 10.5 K/uL   RBC 4.17 3.87 - 5.11 MIL/uL   Hemoglobin 13.0 12.0 - 15.0 g/dL   HCT 38.6 36 - 46 %   MCV 92.6 80.0 - 100.0 fL   MCH 31.2 26.0 - 34.0 pg   MCHC 33.7 30.0 - 36.0 g/dL   RDW 13.8 11.5 - 15.5 %   Platelets 265 150 - 400 K/uL   nRBC 0.0 0.0 - 0.2 %   Neutrophils Relative % 48 %   Neutro Abs 4.2 1.7 - 7.7 K/uL   Lymphocytes Relative 41 %   Lymphs Abs 3.7 0.7 - 4.0 K/uL   Monocytes Relative 10 %   Monocytes Absolute 0.9 0.1 - 1.0 K/uL   Eosinophils Relative 1 %   Eosinophils Absolute 0.1 0 - 0 K/uL   Basophils Relative 0 %   Basophils Absolute 0.0 0 - 0 K/uL   Immature Granulocytes 0 %   Abs Immature Granulocytes 0.03 0.00 - 0.07 K/uL  Basic metabolic panel  Result Value Ref Range   Sodium 139 135 - 145 mmol/L   Potassium 3.4 (L) 3.5 - 5.1 mmol/L   Chloride 102 98 - 111 mmol/L   CO2 25 22 - 32 mmol/L   Glucose, Bld 129 (H) 70 - 99 mg/dL   BUN 32 (H) 8 - 23 mg/dL   Creatinine, Ser 1.66 (H) 0.44 - 1.00 mg/dL   Calcium 9.2 8.9 -  10.3 mg/dL   GFR, Estimated 29 (L) >60 mL/min   Anion gap 12 5 - 15  Troponin I (High Sensitivity)  Result Value Ref Range   Troponin I (High Sensitivity) 8 <18 ng/L   No results found.  EKG EKG Interpretation  Date/Time:  Sunday August 25 2020 02:26:42 EDT Ventricular Rate:  75 PR Interval:    QRS Duration: 103 QT Interval:  457 QTC Calculation: 511 R Axis:   20 Text Interpretation: Sinus rhythm Borderline prolonged PR interval Nonspecific repol abnormality, lateral leads Prolonged QT interval Confirmed by Dory Horn) on 08/25/2020 3:28:46 AM   Radiology No results found.  Procedures Procedures (including critical care time)  Medications Ordered in ED Medications  nicardipine (CARDENE) 20mg  in 0.86% saline 248ml IV infusion (0.1 mg/ml) (7.5 mg/hr Intravenous Rate/Dose Change 08/25/20 0313)  methylPREDNISolone sodium succinate (SOLU-MEDROL) 125 mg/2 mL injection 125 mg (125 mg Intravenous Given 08/25/20 0146)  diphenhydrAMINE (BENADRYL) injection 25 mg (25 mg Intravenous Given 08/25/20 0147)  famotidine (PEPCID) IVPB 20 mg premix (0 mg Intravenous Stopped 08/25/20 0238)  hydrALAZINE (APRESOLINE) injection 5 mg (5 mg Intravenous Given 08/25/20 0155)  EPINEPHrine (EPI-PEN) injection 0.3 mg (0.3 mg Intramuscular Given 08/25/20 0335)    ED Course  I have reviewed the triage vital signs and the nursing notes.  Pertinent labs & imaging results that were available during my care of the patient were reviewed by me and considered in my medical decision making (see chart for details).    MDM Reviewed: previous chart, nursing note and vitals Reviewed previous: labs Interpretation: labs and ECG Total  time providing critical care: 75-105 minutes (epi and cardene drip for BP). This excludes time spent performing separately reportable procedures and services. Consults: critical care  CRITICAL CARE Performed by: Resha Filippone K Kalia Vahey-Rasch Total critical care time: 90  minutes Critical care time was exclusive of separately billable procedures and treating other patients. Critical care was necessary to treat or prevent imminent or life-threatening deterioration. Critical care was time spent personally by me on the following activities: development of treatment plan with patient and/or surrogate as well as nursing, discussions with consultants, evaluation of patient's response to treatment, examination of patient, obtaining history from patient or surrogate, ordering and performing treatments and interventions, ordering and review of laboratory studies, ordering and review of radiographic studies, pulse oximetry and re-evaluation of patient's condition.  Final Clinical Impression(s) / ED Diagnoses Final diagnoses:  Angioedema, initial encounter  Hypertensive crisis   Case d/w Dr. Zola Button who accepts the patient to the ICU  There are no ICU beds   Please go ED to ED and call ICU upon arrival  Dr. Laverta Baltimore accepts to the ED at [facility].   Admit to ICU for BP control and monitoring of airway        Adolphus Hanf, MD 08/25/20 9629

## 2020-08-25 NOTE — H&P (Addendum)
Date: 08/25/2020               Patient Name:  Angie Barrett MRN: [id number]  DOB: [birth year] Age / Sex: 79 y.o., female   PCP: Lilian Coma., MD              Medical Service: Internal Medicine Teaching Service              Attending Physician: Dr. Lucious Groves, DO         First Contact: Dr. Alexandria Lodge Pager: [phone]  Second Contact Dr. Maudie Mercury Pager: [phone]       After Hours (After 5p/  First Contact Pager: [phone]  weekends / holidays): Second Contact Pager: [phone]   Chief Complaint: Angioedema of the tongue  History of Present Illness: Ms. Pennywell is an 79 yo female with a history of HTN, DM2, HLD and angioedema who presents with angioedema of her tongue and hypertensive emergency. She was in her usual state of health at home when she noticed her tongue swelling around midnight. She took one benadryl at home and then she and her husband presented to the [facility] ED where she was found to be hypertensive to 231/81. She was given epinephrine, solu-medrol, benadryl, famotidine, and hydralazine and was started on Cardene drip. She was transferred to [facility] with concern that she may require ICU admission. On arrival to the [facility], she was found to be stable with rapid reduction of her angioedema and improvement in her BPs. On exam, she reports that her tongue has returned to its normal size. She has had two similar episodes of angioedema in the past without features of anaphylaxis or identifiable triggers. She has no family history of angioedema. She denies any recent illness, urticaria, headaches, vision changes, or shortness of breath. No recent dental work.  She voices frustration at still being in the ED and is asking when she will be moved to a room. Advised that hospital is full and this may take some time.   Meds:  Current Meds  Medication Sig  . aspirin 81 MG tablet Take 81 mg by mouth daily.  . calcium carbonate (OSCAL) 1500 (600 Ca) MG TABS tablet  Take 600 mg of elemental calcium by mouth 2 (two) times daily with a meal.  . cholecalciferol (VITAMIN D3) 25 MCG (1000 UNIT) tablet Take 2,000 Units by mouth daily.  Marland Kitchen conjugated estrogens (PREMARIN) vaginal cream Place 1 Applicatorful vaginally 2 (two) times a week.  . fluticasone (FLONASE) 50 MCG/ACT nasal spray Place 2 sprays into both nostrils daily.  . furosemide (LASIX) 20 MG tablet Take 20 mg by mouth 2 (two) times daily.  Marland Kitchen linagliptin (TRADJENTA) 5 MG TABS tablet Take 5 mg by mouth daily.  . metoprolol succinate (TOPROL-XL) 25 MG 24 hr tablet Take 25 mg by mouth daily.  Marland Kitchen NIFEdipine (PROCARDIA PO) Take 90 mg by mouth daily.   Marland Kitchen omeprazole (PRILOSEC) 40 MG capsule Take 40 mg by mouth daily.  Marland Kitchen OMEPRAZOLE PO Take 40 mg by mouth.   . pioglitazone (ACTOS) 15 MG tablet Take 15 mg by mouth daily.  Marland Kitchen PRAVASTATIN SODIUM PO Take 80 mg by mouth.   . Teriparatide, Recombinant, 620 MCG/2.48ML SOPN Inject 620 mcg into the skin.     Allergies: Allergies as of 08/25/2020 - Review Complete 08/25/2020  Allergen Reaction Noted  . Amoxicillin-pot clavulanate Swelling 06/23/2013  . Ciprofloxacin Swelling 04/07/2014  . Clotrimazole Swelling 09/28/2017  . Nystatin Itching 03/03/2019  .  Olmesartan Other (See Comments) and Swelling 06/23/2013  . Hydrochlorothiazide Other (See Comments) 07/13/2019  . Metformin Other (See Comments) 04/11/2014  . Spironolactone Other (See Comments) 07/13/2019  . Telithromycin Other (See Comments) 06/23/2013  . Ace inhibitors  06/23/2013   Past Medical History:  Diagnosis Date  . Diabetes mellitus without complication ([place])   . High cholesterol   . Hypertension     Family History: No family hx of angioedema. Brother with lung CA. Sister with ovarian CA. Strong family history of DM2 and HTN.   Social History: Lives at home with husband. No etOH or drug use. Smoked cigarettes for two years >50 years ago.   Review of Systems: A complete ROS was negative except  as per HPI.   Physical Exam: Blood pressure (!) 181/73, pulse 79, temperature 98 F (36.7 C), temperature source Oral, resp. rate (!) 27, height 5\' 4"  (1.626 m), weight 73.9 kg, SpO2 98 %.  Physical Exam Vitals and nursing note reviewed.  Constitutional:      General: She is not in acute distress. HENT:     Head:     Comments: Tongue appears normal size. Upper airway without occlusion. No cervical, submandibular, or submental adenopathy.  Cardiovascular:     Rate and Rhythm: Normal rate and regular rhythm.     Pulses:          Dorsalis pedis pulses are 2+ on the right side and 2+ on the left side.       Posterior tibial pulses are 2+ on the right side and 2+ on the left side.     Heart sounds: Murmur (holosystolic) heard.  Systolic murmur is present with a grade of 3/6.   Pulmonary:     Effort: Pulmonary effort is normal.     Breath sounds: Normal breath sounds.  Abdominal:     General: There is no distension.     Tenderness: There is no abdominal tenderness.  Musculoskeletal:     Right lower leg: No edema.     Left lower leg: No edema.      EKG: personally reviewed my interpretation is normal sinus rhythm.   CXR: personally reviewed my interpretation is normal CXR.  Assessment & Plan by Problem: Active Problems:   Angioedema  Angioedema: Essential Hypertension History of angioedema with no identifiable trigger and no signs of anaphylaxis. Much improved s/p benadryl, epi, solu-medrol, famotidine with no residual swelling of tongue and no airway compromise. No family history of angioedema. Consider C1 inhibitor deficiency vs idiopathic angioedema.  BPs 231/81-->140/50 on Cardene drip. Returned to 170s/60s off drip.  - Restart home hydralazine 25mg  TID - C3/C4 levels pending - Monitor BPs, if stable can consider discharge tomorrow am - Epi-Pen on discharge.   DM2: On Tadjenta and pioglitazone at home. - SSI while in hospital  3/6 holosystolic murmur: [facility]  at [place]. Chart review reveals history of tricuspid regurgitation on Echo in 2018. Likely also a factor of her hypertension. (180s/70s) - No intervention at this time.   Dispo: Admit patient to Observation with expected length of stay less than 2 midnights.  Signed: Pearla Dubonnet, Medical Student 08/25/2020, 8:45 AM    Attestation for Student Documentation:  I personally was present and performed or re-performed the history, physical exam and medical decision-making activities of this service and have verified that the service and findings are accurately documented in the student's note.  Maudie Mercury, MD 08/27/2020, 6:45 PM

## 2020-08-25 NOTE — ED Notes (Signed)
Assuming care of patient at this time. Pt has Cardene infusing at 5mg /hr with no complications. Pt in NAD. Denies pain. Tongue swelling assessed but patient has patent airway. No difficulty breathing. Able to talk in clear, full sentences. Bed in low position. Call bell within reach.

## 2020-08-25 NOTE — Plan of Care (Signed)
New admission from ED. Reports angioedema is reducing.Vitals done.

## 2020-08-25 NOTE — ED Triage Notes (Signed)
Reports having an allergic reaction.  C/O tongue swelling for the last hour.  Took 1 benadryl pta.

## 2020-08-25 NOTE — ED Notes (Signed)
Admitting Provider at bedside. 

## 2020-08-25 NOTE — H&P (Addendum)
NAME:  Angie Barrett, MRN:  [id number], DOB:  1941-01-21, LOS: 0 ADMISSION DATE:  08/25/2020, CONSULTATION DATE:  08/25/20 REFERRING MD:  Randal Buba, CHIEF COMPLAINT:  Angioedema and HTN  Brief History   This is a 79 yo female with histor of HTN and DM who presented to ED with sudden onset lip swelling. Found to have angioedmea and HTN emergency.   History of present illness   This is a 79 yo female with histor of HTN and DM who presented to ED with sudden onset lip swelling Has history of angioedema previously per prior notes. Found to have angioedmea and HTN emergency.  Recently seen on 03/28/20 in ED department for tongue swelling. Previously notes had noted it could be ACE or ARB related but she had been off these meds on the ED visit on 03/28/20. She was given IM EPI, IV Sollumedrol, pepcid and benadryl with improvement. Given this she was monitored and sent home that same day.  Patient notes that she just recently startrd on new osteoporosis medicaitons and thinks this may have caused things. PAtietn transferred from outside ED to our ED for potential ICU transfer. In our ED patient was able to wean off the cardene drip and doing well. No further swelling of tongue. Patient notes no further swelling or sensation therof. Swallowing fine. No increased WOB or cough. No rashes. No abdominal pain or tenderness.    Past Medical History  DM HTN Angioedema  Significant Hospital Events   NA  Consults:  PCCM  Procedures:  NA  Significant Diagnostic Tests:  NA  Micro Data:  Benson pending  UCX pending  Antimicrobials:  None at this time  Interim history/subjective:  NA  Objective   Blood pressure (!) 147/55, pulse 69, temperature 98 F (36.7 C), temperature source Oral, resp. rate 16, height 5\' 4"  (1.626 m), weight 73.9 kg, SpO2 95 %.        Intake/Output Summary (Last 24 hours) at 08/25/2020 3419 Last data filed at 08/25/2020 0339 Gross per 24 hour  Intake --  Output 600 ml   Net -600 ml   Filed Weights   08/25/20 0141  Weight: 73.9 kg    Examination: General: Patient alert and in no distress. Looks well HENT: Mild swelling of tongue. But posterior pharyngeal structures look reassuring as well as uvuula. Speech clear Lungs: CTAB./ No work of breathing Cardiovascular: RRR Abdomen: Soft. Non tender and non distended Extremities: Mild pitting edema of lower extremities. No rashes or urticaria Neuro: Grossly intact GU: Deferred  Resolved Hospital Problem list   NA  Assessment & Plan:  This is a 79 yo with history as noted above who presented for angioedema found to be hypertensive. Started on Cardene drip  HTN emergency-PAtient on Cardene drip.  But has been off for 20 minutes and systolic remains less than 379 and diastolic less than 02IOXB medications include  Metoprolol, nefidipine 60 daily, coreg 6.25, and hydsralazine 25 TID -Start labetalol 10 mg IV prn for Systolic greater than 353. Hold if heart rate less than 70 and use hydralazine 5mg  iv instead for systolic greater than 299 -Restart hydralazine PO now which would be 25 mg.  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 Is stable from an airway standpoint for now -Trypsin level and CH50, Also C43 and C4 -monitor and give benadryl if needed -May need allergy  consult outpatient to help further investigate -Would consider looking for occult infection ua, ucx, BCX and CMP (for possible cholecystitis or hepatic involvement) -Consider getting vitamins like B12 and B9 ans vitamin deficiencies can give you glossitis   CKD-BL creatinine around 1.5. Elevated to 1.66 tody -Avoid nephrotozxins   DM-Would attain sugars Q6 while fasting and start some sliding scale for now.   Best practice:  Diet: NPO for now Code Status: Full per patient Family  Communication: NA Disposition: Floor  Labs   CBC: Recent Labs  Lab 08/25/20 0203  WBC 8.9  NEUTROABS 4.2  HGB 13.0  HCT 38.6  MCV 92.6  PLT 762    Basic Metabolic Panel: Recent Labs  Lab 08/25/20 0203  NA 139  K 3.4*  CL 102  CO2 25  GLUCOSE 129*  BUN 32*  CREATININE 1.66*  CALCIUM 9.2   GFR: Estimated Creatinine Clearance: 26.6 mL/min (A) (by C-G formula based on SCr of 1.66 mg/dL (H)). Recent Labs  Lab 08/25/20 0203  WBC 8.9    Liver Function Tests: No results for input(s): AST, ALT, ALKPHOS, BILITOT, PROT, ALBUMIN in the last 168 hours. No results for input(s): LIPASE, AMYLASE in the last 168 hours. No results for input(s): AMMONIA in the last 168 hours.  ABG No results found for: PHART, PCO2ART, PO2ART, HCO3, TCO2, ACIDBASEDEF, O2SAT   Coagulation Profile: No results for input(s): INR, PROTIME in the last 168 hours.  Cardiac Enzymes: No results for input(s): CKTOTAL, CKMB, CKMBINDEX, TROPONINI in the last 168 hours.  HbA1C: No results found for: HGBA1C  CBG: No results for input(s): GLUCAP in the last 168 hours.  Review of Systems:   Pertinent positive and negatives per HPI  Past Medical History  She,  has a past medical history of Diabetes mellitus without complication ([place]), High cholesterol, and Hypertension.   Surgical History    Past Surgical History:  Procedure Laterality Date  . LEG SURGERY       Social History   reports that she has never smoked. She has never used smokeless tobacco. She reports previous alcohol use. She reports that she does not use drugs.   Family History   Her family history is not on file.   Allergies Allergies  Allergen Reactions  . Amoxicillin-Pot Clavulanate Swelling  . Ciprofloxacin Swelling  . Clotrimazole Swelling    Could not breathe. Could not breathe.   . Nystatin Itching  . Olmesartan Other (See Comments) and Swelling  . Hydrochlorothiazide Other (See Comments)     hyponatremia hyponatremia   . Metformin Other (See Comments)    Contraindication due to Renal Insufficiency Other reaction(s): CONTRAINDICATED DUE TO RENAL INSUFF Other reaction(s): CONTRAINDICATED DUE TO RENAL INSUFF Contraindication due to Renal Insufficiency   . Spironolactone Other (See Comments)    hyponatremia hyponatremia   . Telithromycin Other (See Comments)    Unknown Reaction per Patient Unknown Reaction per Patient   . Ace Inhibitors      Home Medications  Prior to Admission medications   Medication Sig Start Date End Date Taking? Authorizing Provider  aspirin 81 MG tablet Take 81 mg by mouth daily.   Yes [provider]  calcium carbonate (OSCAL) 1500 (600 Ca) MG TABS tablet Take 600 mg of elemental calcium by mouth 2 (two) times daily with a meal.   Yes [provider]  cholecalciferol (VITAMIN D3) 25 MCG (1000 UNIT) tablet Take 2,000 Units by mouth daily.   Yes [provider]  conjugated estrogens (  PREMARIN) vaginal cream Place 1 Applicatorful vaginally 2 (two) times a week.   Yes [provider]  fluticasone (FLONASE) 50 MCG/ACT nasal spray Place 2 sprays into both nostrils daily.   Yes [provider]  furosemide (LASIX) 20 MG tablet Take 20 mg by mouth 2 (two) times daily.   Yes [provider]  linagliptin (TRADJENTA) 5 MG TABS tablet Take 5 mg by mouth daily.   Yes [provider]  metoprolol succinate (TOPROL-XL) 25 MG 24 hr tablet Take 25 mg by mouth daily.   Yes [provider]  NIFEdipine (PROCARDIA PO) Take 90 mg by mouth daily.    Yes [provider]  omeprazole (PRILOSEC) 40 MG capsule Take 40 mg by mouth daily.   Yes [provider]  OMEPRAZOLE PO Take 40 mg by mouth.    Yes [provider]  pioglitazone (ACTOS) 15 MG tablet Take 15 mg by mouth daily.   Yes [provider]  PRAVASTATIN SODIUM PO Take 80 mg by mouth.    Yes [provider]   Teriparatide, Recombinant, 620 MCG/2.48ML SOPN Inject 620 mcg into the skin.   Yes [provider]  acetaminophen (TYLENOL) 500 MG tablet Take 2 tablets (1,000 mg total) by mouth every 6 (six) hours as needed. 05/22/18   Charlesetta Shanks, MD  carvedilol (COREG) 6.25 MG tablet Take 6.25 mg by mouth 2 (two) times daily with a meal.    [provider]  hydrALAZINE (APRESOLINE) 25 MG tablet Take 1 tablet (25 mg total) by mouth 3 (three) times daily. 05/22/18   Charlesetta Shanks, MD  meclizine (ANTIVERT) 25 MG tablet Take 1 tablet (25 mg total) by mouth 3 (three) times daily as needed. 06/23/13   Truddie Hidden, MD  SitaGLIPtin Phosphate (JANUVIA PO) Take by mouth.    [provider]  spironolactone (ALDACTONE) 25 MG tablet Take 12.5 mg by mouth daily.     [provider]     Critical care time: 93

## 2020-08-25 NOTE — ED Notes (Signed)
Notified provider pt's BP is trending down to 133/60 with Cardene gtt infusing at 2.5mg /hr. Verbal order rec;d to hold Cardene gtt at this time and continue to monitor patient's BP. Keep systolic at 191-660 for parameters.

## 2020-08-25 NOTE — ED Provider Notes (Signed)
  Provider Note MRN:  [id number]  Arrival date & time: 08/25/20    ED Course and Medical Decision Making  Assumed care from Dr. Nicholes Stairs upon patient transfer.  Angioedema as well as hypertensive urgency/emergency, on Cardene drip, no acute distress, normal vital signs, mildly swollen tongue.  Per report has already been accepted for admission to the ICU, will confirm this with Dr. Lucile Shutters.  We will continue to monitor closely.  .Critical Care Performed by: Maudie Flakes, MD Authorized by: Maudie Flakes, MD   Critical care provider statement:    Critical care time (minutes):  31   Critical care was necessary to treat or prevent imminent or life-threatening deterioration of the following conditions: Hypertensive crisis, angioedema.   Critical care was time spent personally by me on the following activities:  Discussions with consultants, evaluation of patient's response to treatment, examination of patient, ordering and performing treatments and interventions, ordering and review of laboratory studies, ordering and review of radiographic studies, pulse oximetry, re-evaluation of patient's condition, obtaining history from patient or surrogate and review of old charts   I assumed direction of critical care for this patient from another provider in my specialty: yes      Final Clinical Impressions(s) / ED Diagnoses     ICD-10-CM   1. Angioedema, initial encounter  T78.3XXA   2. Hypertensive crisis  I16.9     ED Discharge Orders    None      Discharge Instructions   None     Barth Kirks. Sedonia Small, [place] [email or web address]    Maudie Flakes, MD 08/25/20 0600

## 2020-08-25 NOTE — ED Provider Notes (Signed)
Patient seen by CCM.  To be admitted to medicine.  Medicine service contacted and will evaluate for admission.   Valarie Merino, MD 08/25/20 [phone]

## 2020-08-26 LAB — BASIC METABOLIC PANEL
Anion gap: 12 (ref 5–15)
BUN: 30 mg/dL — ABNORMAL HIGH (ref 8–23)
CO2: 24 mmol/L (ref 22–32)
Calcium: 9.6 mg/dL (ref 8.9–10.3)
Chloride: 104 mmol/L (ref 98–111)
Creatinine, Ser: 1.5 mg/dL — ABNORMAL HIGH (ref 0.44–1.00)
GFR, Estimated: 33 mL/min — ABNORMAL LOW (ref >60)
Glucose, Bld: 176 mg/dL — ABNORMAL HIGH (ref 70–99)
Potassium: 3.9 mmol/L (ref 3.5–5.1)
Sodium: 140 mmol/L (ref 135–145)

## 2020-08-26 LAB — CBC
HCT: 35.6 % — ABNORMAL LOW (ref 36.0–46.0)
Hemoglobin: 11.8 g/dL — ABNORMAL LOW (ref 12.0–15.0)
MCH: 30.7 pg (ref 26.0–34.0)
MCHC: 33.1 g/dL (ref 30.0–36.0)
MCV: 92.7 fL (ref 80.0–100.0)
Platelets: 259 10*3/uL (ref 150–400)
RBC: 3.84 MIL/uL — ABNORMAL LOW (ref 3.87–5.11)
RDW: 14.1 % (ref 11.5–15.5)
WBC: 12.1 10*3/uL — ABNORMAL HIGH (ref 4.0–10.5)
nRBC: 0 % (ref 0.0–0.2)

## 2020-08-26 LAB — GLUCOSE, CAPILLARY
Glucose-Capillary: 122 mg/dL — ABNORMAL HIGH (ref 70–99)
Glucose-Capillary: 129 mg/dL — ABNORMAL HIGH (ref 70–99)
Glucose-Capillary: 137 mg/dL — ABNORMAL HIGH (ref 70–99)
Glucose-Capillary: 143 mg/dL — ABNORMAL HIGH (ref 70–99)
Glucose-Capillary: 144 mg/dL — ABNORMAL HIGH (ref 70–99)
Glucose-Capillary: 148 mg/dL — ABNORMAL HIGH (ref 70–99)

## 2020-08-26 LAB — C4 COMPLEMENT: Complement C4, Body Fluid: 66 mg/dL — ABNORMAL HIGH (ref 12–38)

## 2020-08-26 LAB — C3 COMPLEMENT: C3 Complement: 153 mg/dL (ref 82–167)

## 2020-08-26 MED ORDER — NIFEDIPINE ER OSMOTIC RELEASE 90 MG PO TB24
90.0000 mg | ORAL_TABLET | Freq: Every day | ORAL | Status: DC
  Administered 2020-08-26 – 2020-08-27 (×2): 90 mg via ORAL
  Filled 2020-08-26 (×2): qty 1

## 2020-08-26 MED ORDER — NIFEDIPINE 10 MG PO CAPS: 90.0000 mg | ORAL_CAPSULE | Freq: Every day | ORAL | Status: DC

## 2020-08-26 MED ORDER — METOPROLOL SUCCINATE ER 25 MG PO TB24
25.0000 mg | ORAL_TABLET | Freq: Every day | ORAL | Status: DC
  Administered 2020-08-26 – 2020-08-27 (×2): 25 mg via ORAL
  Filled 2020-08-26 (×2): qty 1

## 2020-08-26 NOTE — Care Management Obs Status (Signed)
[place] NOTIFICATION   Patient Details  Name: Angie Barrett MRN: [id number] Date of Birth: 1940/11/28   Medicare Observation Status Notification Given:   CSW presented Obs Status notification. Pt signed and was given paper copy.     Bethann Berkshire, LCSW 08/26/2020, 4:11 PM

## 2020-08-26 NOTE — Progress Notes (Signed)
HD#0 Subjective:   Overnight Events: Received PRN labetolol 10 mg and hydral 5 mg for SBP >180.  Evaluated at bedside during rounds. Patient resting comfortably in bed watching TV. Reports her tongue is back to its baseline size. Explained to patient blood pressures are elevated and we are slowly adding back home medications. Denies blurry vision, headache. She states she can tell when her blood pressure is elevated, but has not felt that way since being admitted. Patient states her blood pressure was stabilized on home medication reigmen, denies being on hydralazine at home, notes she is on metoprolol 25 mg and nifedipine 90 mg daily.   Patient's PCP is Dr. Tiana Loft of [place]. Patient states it is easy to obtain a follow up appointment with him.  Objective:   Vital signs in last 24 hours: Vitals:   08/26/20 0659 08/26/20 0759 08/26/20 0857 08/26/20 1222  BP: (!) 196/59 (!) 182/61 (!) 169/64 (!) 171/61  Pulse: 61 60 65 63  Resp:    18  Temp:    98.7 F (37.1 C)  TempSrc:    Oral  SpO2:    98%  Weight:      Height:       Supplemental O2: Room Air SpO2: 98 %  Physical Exam Constitutional: well-appearing woman, appears younger than stated age, lying in hospital bed, in no acute distress HENT: normocephalic atraumatic, mucous membranes moist, tongue appears normal in size Cardiovascular: regular rate and rhythm, 3/6 holosystolic murmur  Pulmonary/Chest: normal work of breathing on room air, lungs clear to auscultation bilaterally Abdominal: soft, non-tender, non-distended   Pertinent Labs: CBC Latest Ref Rng & Units 08/26/2020 08/25/2020 03/28/2020  WBC 4.0 - 10.5 K/uL 12.1(H) 8.9 8.4  Hemoglobin 12.0 - 15.0 g/dL 11.8(L) 13.0 13.6  Hematocrit 36 - 46 % 35.6(L) 38.6 39.7  Platelets 150 - 400 K/uL 259 265 296    CMP Latest Ref Rng & Units 08/26/2020 08/25/2020 03/28/2020  Glucose 70 - 99 mg/dL 176(H) 129(H) 149(H)  BUN 8 - 23 mg/dL 30(H) 32(H) 26(H)  Creatinine 0.44 -  1.00 mg/dL 1.50(H) 1.66(H) 1.56(H)  Sodium 135 - 145 mmol/L 140 139 137  Potassium 3.5 - 5.1 mmol/L 3.9 3.4(L) 3.0(L)  Chloride 98 - 111 mmol/L 104 102 98  CO2 22 - 32 mmol/L 24 25 25   Calcium 8.9 - 10.3 mg/dL 9.6 9.2 9.3  Total Protein 6.5 - 8.1 g/dL - - 7.5  Total Bilirubin 0.3 - 1.2 mg/dL - - 0.6  Alkaline Phos 38 - 126 U/L - - 53  AST 15 - 41 U/L - - 24  ALT 0 - 44 U/L - - 26     Assessment/Plan:   Active Problems:   Angioedema   Patient Summary: Angie Barrett is a 79 y.o. woman with history of HTN, DM2, HLD and angioedema who presented with tongue swelling and elevated blood pressures and was admitted for management of hypertensive urgency and work-up and treatment of angioedema.   Angioedema Hypertensive urgency Tongue swelling completely resolved on exam this morning. C3/C4 levels pending. BPs improving after addition of hydralazine 25 mg TID, however patient reports she is no longer taking this at home. She denies headache or blurry vision. Will restart home antihypertensives, nifedipine 90 mg daily and metoprolol 25 mg daily and if pressures are stable, will discharge patient home later today with PCP follow-up.  - Restart home nifedipine 90 mg daily and metoprolol 25 mg daily - Discontinue home hydralazine 25 mg TID -  C3/C4 levels pending - Epi-Pen on discharge  Type 2 Diabetes Mellitus On Tradjenta and pioglitazone at home.  - SSI while in hospital, resume home meds at discharge   Diet: Carb-Modified IVF: None,None VTE: Enoxaparin Code: Full   Dispo: Anticipated discharge to Home in 0-1 days pending stabilization of BP on home antihypertensives.    Please contact the on call pager after 5 pm and on weekends at [phone].  Alexandria Lodge, MD PGY-1 Internal Medicine Teaching Service Pager: [phone] 08/26/2020

## 2020-08-27 DIAGNOSIS — T783XXA Angioneurotic edema, initial encounter: Secondary | ICD-10-CM | POA: Diagnosis not present

## 2020-08-27 DIAGNOSIS — E119 Type 2 diabetes mellitus without complications: Secondary | ICD-10-CM | POA: Diagnosis not present

## 2020-08-27 DIAGNOSIS — I160 Hypertensive urgency: Secondary | ICD-10-CM

## 2020-08-27 DIAGNOSIS — I10 Essential (primary) hypertension: Secondary | ICD-10-CM | POA: Diagnosis not present

## 2020-08-27 LAB — BASIC METABOLIC PANEL
Anion gap: 11 (ref 5–15)
BUN: 34 mg/dL — ABNORMAL HIGH (ref 8–23)
CO2: 23 mmol/L (ref 22–32)
Calcium: 9.4 mg/dL (ref 8.9–10.3)
Chloride: 104 mmol/L (ref 98–111)
Creatinine, Ser: 1.57 mg/dL — ABNORMAL HIGH (ref 0.44–1.00)
GFR, Estimated: 31 mL/min — ABNORMAL LOW (ref >60)
Glucose, Bld: 126 mg/dL — ABNORMAL HIGH (ref 70–99)
Potassium: 3.8 mmol/L (ref 3.5–5.1)
Sodium: 138 mmol/L (ref 135–145)

## 2020-08-27 LAB — CBC WITH DIFFERENTIAL/PLATELET
Abs Immature Granulocytes: 0.06 10*3/uL (ref 0.00–0.07)
Basophils Absolute: 0.1 10*3/uL (ref 0.0–0.1)
Basophils Relative: 1 %
Eosinophils Absolute: 0.2 10*3/uL (ref 0.0–0.5)
Eosinophils Relative: 2 %
HCT: 34.9 % — ABNORMAL LOW (ref 36.0–46.0)
Hemoglobin: 11.7 g/dL — ABNORMAL LOW (ref 12.0–15.0)
Immature Granulocytes: 1 %
Lymphocytes Relative: 26 %
Lymphs Abs: 2.4 10*3/uL (ref 0.7–4.0)
MCH: 31.5 pg (ref 26.0–34.0)
MCHC: 33.5 g/dL (ref 30.0–36.0)
MCV: 93.8 fL (ref 80.0–100.0)
Monocytes Absolute: 0.7 10*3/uL (ref 0.1–1.0)
Monocytes Relative: 7 %
Neutro Abs: 5.9 10*3/uL (ref 1.7–7.7)
Neutrophils Relative %: 63 %
Platelets: 253 10*3/uL (ref 150–400)
RBC: 3.72 MIL/uL — ABNORMAL LOW (ref 3.87–5.11)
RDW: 14.3 % (ref 11.5–15.5)
WBC: 9.3 10*3/uL (ref 4.0–10.5)
nRBC: 0 % (ref 0.0–0.2)

## 2020-08-27 LAB — GLUCOSE, CAPILLARY
Glucose-Capillary: 115 mg/dL — ABNORMAL HIGH (ref 70–99)
Glucose-Capillary: 127 mg/dL — ABNORMAL HIGH (ref 70–99)
Glucose-Capillary: 159 mg/dL — ABNORMAL HIGH (ref 70–99)

## 2020-08-27 MED ORDER — EPINEPHRINE 0.3 MG/0.3ML IJ SOAJ: 0.3000 mg | INTRAMUSCULAR | 1 refills | Status: AC | PRN

## 2020-08-27 MED ORDER — TERIPARATIDE 620 MCG/2.48ML ~~LOC~~ SOPN
20.0000 ug | PEN_INJECTOR | Freq: Every day | SUBCUTANEOUS | 0 refills | Status: DC
Start: 2020-08-27 — End: 2021-12-05

## 2020-08-27 NOTE — Discharge Instructions (Addendum)
[name],   It was a pleasure taking care of you. You were admitted to the hospital and treated for angioedema of your tongue. You were treated with Benadryl, epinephrine, steroids, and famotidine (Pepcid). Because you have experienced angioedema previously, we also checked your blood work to see if you may have a hereditary predisposition to angioedema, and this test was negative. This means we are not exactly sure why you developed the swelling. We are sending a prescription for an Epi-pen to your pharmacy. If you were to develop swelling again, use your Epi-pen and call your doctor or go to the emergency room, and you may also take Benadryl and Pepcid.  You blood pressure was also elevated. We would like for you to resume your home blood pressure medicines at discharge and follow-up with your primary care doctor in the next week.  For your information, I called your PCP's office to clarify your teriparatide dosing. You are supposed to inject 20 mcg into your skin daily (our system had 620 mcg documented).  Take care!      Angioedema Angioedema is the sudden swelling of tissue in the body. Angioedema can affect any part of the body, but it most often affects the deeper parts of the skin, causing red, itchy patches (hives) to appear over the affected area. It often begins during the night and is found in the morning. Depending on the cause, angioedema may happen:  Only once.  Several times. It may come back in unpredictable patterns.  Repeatedly for several years. Over time, it may gradually stop coming back. Angioedema can be life-threatening if it affects the air passages that you breathe through. What are the causes? This condition may be caused by:  Foods, such as milk, eggs, shellfish, wheat, or nuts.  Certain medicines, such as ACE inhibitors, antibiotics, nonsteroidal anti-inflammatory drugs, birth control pills, or dyes used in X-rays.  Insect  stings.  Infections. Angioedema can be inherited, and episodes can be triggered by:  Mild injury.  Dental work.  Surgery.  Stress.  Sudden changes in temperature.  Exercise. In some cases, the cause of this condition is not known. What are the signs or symptoms? Symptoms of this condition depend on where the swelling happens. Symptoms may include:  Swollen skin.  Red, itchy patches of skin (hives).  Redness in the affected area.  Pain in the affected area.  Swollen lips or tongue.  Wheezing.  Breathing problems.  If your internal organs are involved, symptoms may also include:  Nausea.  Abdominal pain.  Vomiting.  Difficulty swallowing.  Difficulty passing urine. How is this diagnosed? This condition may be diagnosed based on:  An exam of the affected area.  Your medical history.  Whether anyone in your family has had this condition before.  A review of any medicines you have been taking.  Tests, including: ? Allergy skin tests to see if the condition was caused by an allergic reaction. ? Blood tests to see if the condition was caused by a gene. ? Tests to check for underlying diseases that could cause the condition. How is this treated? Treatment for this condition depends on the cause. It may involve any of the following:  If something triggered the condition, making changes to keep it from triggering the condition again.  If the condition affects your breathing, having tubes placed in your airway to keep it open.  Taking medicines to treat symptoms or prevent future episodes. These may include: ? Antihistamines. ? Epinephrine injections. ?  Steroids. If your condition is severe, you may need to be treated at the hospital. Angioedema usually gets better in 24-48 hours. Follow these instructions at home:  Take over-the-counter and prescription medicines only as told by your health care provider.  If you were given medicines for emergency  allergy treatment, always carry them with you.  Wear a medical bracelet as told by your health care provider.  If something triggers your condition, avoid the trigger, if possible.  If your condition is inherited and you are thinking about having children, talk to your health care provider. It is important to discuss the risks of passing on the condition to your children. Contact a health care provider if:  You have repeated episodes of angioedema.  Episodes of angioedema start to happen more often than they used to, even after you take steps to prevent them.  You have episodes of angioedema that are more severe than they have been before, even after you take steps to prevent them.  You are thinking about having children. Get help right away if:  You have severe swelling of your mouth, tongue, or lips.  You have trouble breathing.  You have trouble swallowing.  You faint. This information is not intended to replace advice given to you by your health care provider. Make sure you discuss any questions you have with your health care provider. Document Revised: 10/15/2017 Document Reviewed: 05/12/2016 Elsevier Patient Education  2020 [employer].

## 2020-08-27 NOTE — Discharge Summary (Signed)
Name: Angie Barrett MRN: [id number] DOB: Jul 24, 1941 79 y.o. PCP: Lilian Coma., MD  Date of Admission: 08/25/2020  1:36 AM Date of Discharge: 08/27/2020 Attending Physician: Dr. Joni Reining  Discharge Diagnosis: 1. Angioedema 2. Hypertensive urgency 3. Essential hypertension  Discharge Medications: Allergies as of 08/27/2020      Reactions   Amoxicillin-pot Clavulanate Swelling   Ciprofloxacin Swelling   Clotrimazole Swelling   Could not breathe. Could not breathe.   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   aspirin 81 MG tablet Take 81 mg by mouth daily.   calcium carbonate 1500 (600 Ca) MG Tabs tablet Commonly known as: OSCAL Take 600 mg of elemental calcium by mouth 2 (two) times daily with a meal.   cholecalciferol 25 MCG (1000 UNIT) tablet Commonly known as: VITAMIN D3 Take 2,000 Units by mouth daily.   conjugated estrogens vaginal cream Commonly known as: PREMARIN Place 1 Applicatorful vaginally 2 (two) times a week.   EPINEPHrine 0.3 mg/0.3 mL Soaj injection Commonly known as: EPI-PEN Inject 0.3 mg into the muscle as needed for anaphylaxis.   fluticasone 50 MCG/ACT nasal spray Commonly known as: FLONASE Place 2 sprays into both nostrils daily as needed for allergies or rhinitis.   furosemide 20 MG tablet Commonly known as: LASIX Take 20 mg by mouth 2 (two) times daily.   linagliptin 5 MG Tabs tablet Commonly known as:  TRADJENTA Take 5 mg by mouth daily.   meclizine 25 MG tablet Commonly known as: ANTIVERT Take 1 tablet (25 mg total) by mouth 3 (three) times daily as needed.   metoprolol succinate 25 MG 24 hr tablet Commonly known as: TOPROL-XL Take 25 mg by mouth daily.   NIFEdipine 90 MG 24 hr tablet Commonly known as: PROCARDIA XL/NIFEDICAL-XL Take 90 mg by mouth daily.   omeprazole 40 MG capsule Commonly known as: PRILOSEC Take 40 mg by mouth daily.   pioglitazone 15 MG tablet Commonly known as: ACTOS Take 15 mg by mouth daily.   PRAVASTATIN SODIUM PO Take 80 mg by mouth daily.   Teriparatide (Recombinant) 620 MCG/2.48ML Sopn Inject 20 mcg into the skin daily. What changed: how much to take       Disposition and follow-up:   AngieAngie Barrett was discharged from [facility] in Good condition.  At the hospital follow up visit please address:  1.    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 Consider referral to Allergy & Immunology for further work-up (C3 and C4 levels were normal) - Hypertension: patient resumed on her home antihypertensives, nifedipine 90 mg daily and metoprolol 25 mg daily. Check BP and consider adjustment in her home regimen given persistently elevated BP while inpatient.  2.  Labs / imaging needed at time of follow-up: none  3.  Pending labs/ test needing follow-up: none  Follow-up Appointments:  Follow-up Information    Lilian Coma.,  MD. Schedule an appointment as soon as possible for a visit in 1 week(s).   Specialty: Internal Medicine Contact information: [address] [address] [phone]               Hospital Course by problem list:  Angie Barrett is a 79 y.o. woman with history of HTN, DM2, HLD and angioedema who presented with tongue swelling and elevated blood pressures and was admitted for management of hypertensive urgency and work-up  and treatment of angioedema.  Angioedema Hypertensive urgency Angie Barrett presented initially to [facility] with tongue swelling and found to by hypertensive to 231/81. She was given epinephrine, solu-medrol, benadryl, famotidine, and hydralazine and was started on Cardene drip. She was transferred to [facility] with concern that she may require ICU admission. On arrival to the [facility], she was found to be stable with rapid reduction of her angioedema and improvement in her BPs. During admission, her tongue returned to its normal size. Given her history of two similar episodes of angioedema in the past without features of anaphylaxis or identifiable triggers, no family history of angioedema, obtained C3 and C4 levels to screen for hereditary angioedema (C1 esterase deficiency), which was negative. Her BP's remained elevated after restarting her home medications (nifedipine 90 mg daily and metoprolol 25 mg daily), requiring PRN antihypertensives for systolic BP >659, however the patient remained asymptomatic and well-appearing. She was discharged home on her home antihypertensive regimen and recommended close follow-up with her PCP.  Discharge Vitals:   BP (!) 172/60 (BP Location: Left Arm)   Pulse (!) 54   Temp 98.1 F (36.7 C) (Oral)   Resp 18   Ht 5\' 4"  (1.626 m)   Wt 73.9 kg   SpO2 99%   BMI 27.98 kg/m   Pertinent Labs, Studies, and Procedures:   C3 complement: 153 C4 complement: 66  Discharge Instructions: Discharge Instructions    Call MD for:  difficulty breathing, headache or visual disturbances   Complete by: As directed    Call MD for:  hives   Complete by: As directed    Call MD for:  persistant nausea and vomiting   Complete by: As directed    Increase activity slowly   Complete by: As directed      Angie Barrett,   It was a pleasure taking care of you. You were admitted to the hospital and treated for angioedema of your tongue. You were treated with Benadryl,  epinephrine, steroids, and famotidine (Pepcid). Because you have experienced angioedema previously, we also checked your blood work to see if you may have a hereditary predisposition to angioedema, and this test was negative. This means we are not exactly sure why you developed the swelling. We are sending a prescription for an Epi-pen to your pharmacy. If you were to develop swelling again, use your Epi-pen and call your doctor or go to the emergency room, and you may also take Benadryl and Pepcid.  You blood pressure was also elevated. We would like for you to resume your home blood pressure medicines at discharge and follow-up with your primary care doctor in the next week.  For your information, I called your PCP's office to clarify your teriparatide dosing. You are supposed to inject 20 mcg into your skin daily (our system had 620 mcg documented).  Take care!   Signed: Alexandria Lodge, MD 08/28/2020, 3:00 PM   Pager: [phone]

## 2020-08-27 NOTE — Progress Notes (Addendum)
HD#0 Subjective:   Overnight Events: Received PRN hydral 5 mg x 2 for SBP >180. Patient was asymptomatic.  Evaluated at bedside during rounds. Patient states she feels good this morning. Denies being symptomatic from high blood pressure. Informed patient we started on home medications. Denies anymore tongue swelling. Informed patient c3/c4 esterase was unremarkable. Patient states she is ready to go home and will follow closely with primary care doctor. Encouraged patient to talk with PCP about allergy referral. Discussed with patient plan to prescribe epi pen and that this information will be in her discharge instructions.   Objective:   Vital signs in last 24 hours: Vitals:   08/26/20 2219 08/27/20 0000 08/27/20 0157 08/27/20 0500  BP: (!) 187/60 (!) 155/79 (!) 180/56 (!) 184/62  Pulse:  (!) 57 (!) 52 (!) 54  Resp: 16 17 17 18   Temp:  98.6 F (37 C) 98.1 F (36.7 C)   TempSrc:  Oral Oral   SpO2: 97% 99% 97% 99%  Weight:      Height:       Physical Exam Constitutional: well-appearing woman, appears younger than stated age, lying in hospital bed, in no acute distress HENT: normocephalic atraumatic, mucous membranes moist, tongue appears normal in size Cardiovascular: regular rate and rhythm, 3/6 holosystolic murmur  Pulmonary/Chest: normal work of breathing on room air, lungs clear to auscultation bilaterally Abdominal: soft, non-tender, non-distended  Pertinent Labs: CBC Latest Ref Rng & Units 08/27/2020 08/26/2020 08/25/2020  WBC 4.0 - 10.5 K/uL 9.3 12.1(H) 8.9  Hemoglobin 12.0 - 15.0 g/dL 11.7(L) 11.8(L) 13.0  Hematocrit 36 - 46 % 34.9(L) 35.6(L) 38.6  Platelets 150 - 400 K/uL 253 259 265    CMP Latest Ref Rng & Units 08/27/2020 08/26/2020 08/25/2020  Glucose 70 - 99 mg/dL 126(H) 176(H) 129(H)  BUN 8 - 23 mg/dL 34(H) 30(H) 32(H)  Creatinine 0.44 - 1.00 mg/dL 1.57(H) 1.50(H) 1.66(H)  Sodium 135 - 145 mmol/L 138 140 139  Potassium 3.5 - 5.1 mmol/L 3.8 3.9 3.4(L)    Chloride 98 - 111 mmol/L 104 104 102  CO2 22 - 32 mmol/L 23 24 25   Calcium 8.9 - 10.3 mg/dL 9.4 9.6 9.2  Total Protein 6.5 - 8.1 g/dL - - -  Total Bilirubin 0.3 - 1.2 mg/dL - - -  Alkaline Phos 38 - 126 U/L - - -  AST 15 - 41 U/L - - -  ALT 0 - 44 U/L - - -     Assessment/Plan:   Active Problems:   Angioedema   Patient Summary: Angie Barrett is a 79 y.o. woman with history of HTN, DM2, HLD and angioedema who presented with tongue swelling and elevated blood pressures and was admitted for management of hypertensive urgency and work-up and treatment of angioedema.  Angioedema Essential Hypertension Tongue swelling completely resolved since yesterday. C3/C4 levels within normal limits. BPs improved after restarting home medications (nifedipine 90 mg daily and metoprolol 25 mg daily), however patient did require PRN antihypertensives for systolic BP >884. Since she remains asymptomatic, will discharge home with close PCP follow-up.  - Will recommend PCP consider allergy & immunology referral for angioedema - Discharge home on nifedipine 90 mg daily and metoprolol 25 mg daily - Epi-Pen on discharge  Type 2 Diabetes Mellitus On Tradjenta and pioglitazone at home.  - SSI while in hospital, resume home meds at discharge   Diet: Carb-Modified IVF: None,None VTE: Enoxaparin Code: Full   Dispo: Anticipated discharge to Home today.   Please  contact the on call pager after 5 pm and on weekends at [phone].  Alexandria Lodge, MD PGY-1 Internal Medicine Teaching Service Pager: [phone] 08/27/2020

## 2020-10-19 ENCOUNTER — Other Ambulatory Visit: Payer: Self-pay

## 2020-10-19 ENCOUNTER — Emergency Department (HOSPITAL_BASED_OUTPATIENT_CLINIC_OR_DEPARTMENT_OTHER)
Admission: EM | Admit: 2020-10-19 | Discharge: 2020-10-19 | Disposition: A | Discharge status: Home / Self Care | Payer: Medicare HMO | Attending: Emergency Medicine | Admitting: Emergency Medicine

## 2020-10-19 ENCOUNTER — Encounter (HOSPITAL_BASED_OUTPATIENT_CLINIC_OR_DEPARTMENT_OTHER): Payer: Self-pay | Admitting: Emergency Medicine

## 2020-10-19 DIAGNOSIS — T783XXA Angioneurotic edema, initial encounter: Secondary | ICD-10-CM | POA: Diagnosis not present

## 2020-10-19 DIAGNOSIS — I10 Essential (primary) hypertension: Secondary | ICD-10-CM | POA: Diagnosis not present

## 2020-10-19 DIAGNOSIS — Z79899 Other long term (current) drug therapy: Secondary | ICD-10-CM | POA: Diagnosis not present

## 2020-10-19 DIAGNOSIS — Z7982 Long term (current) use of aspirin: Secondary | ICD-10-CM | POA: Insufficient documentation

## 2020-10-19 DIAGNOSIS — E119 Type 2 diabetes mellitus without complications: Secondary | ICD-10-CM | POA: Insufficient documentation

## 2020-10-19 LAB — CBG MONITORING, ED: Glucose-Capillary: 159 mg/dL — ABNORMAL HIGH (ref 70–99)

## 2020-10-19 MED ORDER — HYDRALAZINE HCL 20 MG/ML IJ SOLN
10.0000 mg | Freq: Once | INTRAMUSCULAR | Status: AC
  Administered 2020-10-19: 10 mg via INTRAVENOUS
  Filled 2020-10-19: qty 1

## 2020-10-19 MED ORDER — DEXAMETHASONE SODIUM PHOSPHATE 10 MG/ML IJ SOLN
10.0000 mg | Freq: Once | INTRAMUSCULAR | Status: AC
  Administered 2020-10-19: 10 mg via INTRAVENOUS
  Filled 2020-10-19: qty 1

## 2020-10-19 MED ORDER — FAMOTIDINE IN NACL 20-0.9 MG/50ML-% IV SOLN
20.0000 mg | Freq: Once | INTRAVENOUS | Status: AC
  Administered 2020-10-19: 20 mg via INTRAVENOUS
  Filled 2020-10-19: qty 50

## 2020-10-19 MED ORDER — DIPHENHYDRAMINE HCL 50 MG/ML IJ SOLN
25.0000 mg | Freq: Once | INTRAMUSCULAR | Status: AC
  Administered 2020-10-19: 25 mg via INTRAVENOUS
  Filled 2020-10-19: qty 1

## 2020-10-19 NOTE — ED Notes (Signed)
She has just ambulated to b.r. and back without difficulty. She tells me she continues to feel "better".

## 2020-10-19 NOTE — ED Notes (Signed)
ED Provider at bedside. 

## 2020-10-19 NOTE — Discharge Instructions (Signed)
For the next 2 days, you can take 25 mg of Benadryl every 6 hours for tongue swelling.  If you feel like your throat swelling, or you are having difficulty breathing, or you begin to feel very lightheaded, you should return to the ER immediately.

## 2020-10-19 NOTE — ED Provider Notes (Signed)
[facility] EMERGENCY DEPARTMENT Provider Note   CSN: [id number] Arrival date & time: 10/19/20  0720     History Chief Complaint  Patient presents with  . Oral Swelling    Angie Barrett is a 79 y.o. 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 medications, high cholesterol, hypertension, presented to emergency department with tongue swelling.  Patient reports that she woke up around approximately 4 AM this morning and noted that her tongue was swollen.  She felt normal going to bed last night.  She reports the swelling is been about the same since it started today.  She denies any throat swelling, difficulty breathing.  She took 5 mg of p.o. prednisone, 25 mg of p.o. Benadryl, as well as her morning 25 mg metoprolol for blood pressure.  She did not take her morning nicardipine, or any other medications.  Per medical record review and the patient's history, she had a similar episode in the past, most recently in October of this year.  She was hospitalized for 2 days at that time.  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  She reports that she has made multiple appointments with the center, but they constantly defer her for another 6 weeks every time she has another episode, because it may interfere with her testing.  While she was in the hospital in October, there was concern for significantly elevated blood pressure with her systolics up to 973.  She was restarted on her home doses of medications and given several as needed doses of antihypertensives.  There was no other report of acute endorgan damage from hypertensive emergency at that time per her discharge summary report on 08/27/20.  Today she denies to me that she is having a headache, blurred vision, chest pain, shortness of breath.  She is not on any ACE inhibitor's.  HPI     Past Medical History:  Diagnosis Date  . Diabetes  mellitus without complication ([place])   . High cholesterol   . Hypertension     Patient Active Problem List   Diagnosis Date Noted  . 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 LEG SURGERY       OB History   No obstetric history on file.     No family history on file.  Social History   Tobacco Use  . Smoking status: Never Smoker  . Smokeless tobacco: Never Used  Vaping Use  . Vaping Use: Never used  Substance Use Topics  . Alcohol use: Not Currently  . Drug use: Never    Home Medications Prior to Admission medications   Medication Sig Start Date End Date Taking? Authorizing Provider  acetaminophen (TYLENOL) 500 MG tablet Take 2 tablets (1,000 mg total) by mouth every 6 (six) hours as needed. 05/22/18   Charlesetta Shanks, MD  aspirin 81 MG tablet Take 81 mg by mouth daily.    [provider]  calcium carbonate (OSCAL) 1500 (600 Ca) MG TABS tablet Take 600 mg of elemental calcium by mouth 2 (two) times daily with a meal.    [provider]  cholecalciferol (VITAMIN D3) 25 MCG (1000 UNIT) tablet Take 2,000 Units by mouth daily.    [provider]  conjugated estrogens (PREMARIN) vaginal cream Place 1 Applicatorful vaginally 2 (two) times a week.    [provider]  EPINEPHrine 0.3 mg/0.3 mL IJ SOAJ injection Inject 0.3  mg into the muscle as needed for anaphylaxis. 08/27/20   Alexandria Lodge, MD  fluticasone (FLONASE) 50 MCG/ACT nasal spray Place 2 sprays into both nostrils daily as needed for allergies or rhinitis.     [provider]  furosemide (LASIX) 20 MG tablet Take 20 mg by mouth 2 (two) times daily.    [provider]  linagliptin (TRADJENTA) 5 MG TABS tablet Take 5 mg by mouth daily.    [provider]  meclizine (ANTIVERT) 25 MG tablet Take 1 tablet (25 mg total) by mouth 3 (three) times daily as needed. Patient not taking: Reported on 08/25/2020 06/23/13   Truddie Hidden,  MD  metoprolol succinate (TOPROL-XL) 25 MG 24 hr tablet Take 25 mg by mouth daily.    [provider]  NIFEdipine (PROCARDIA XL/NIFEDICAL-XL) 90 MG 24 hr tablet Take 90 mg by mouth daily.    [provider]  omeprazole (PRILOSEC) 40 MG capsule Take 40 mg by mouth daily.    [provider]  pioglitazone (ACTOS) 15 MG tablet Take 15 mg by mouth daily.    [provider]  PRAVASTATIN SODIUM PO Take 80 mg by mouth daily.     [provider]  Teriparatide, Recombinant, 620 MCG/2.48ML SOPN Inject 20 mcg into the skin daily. 08/27/20   Alexandria Lodge, MD    Allergies    Amoxicillin-pot clavulanate, Ciprofloxacin, Clotrimazole, Nystatin, Olmesartan, Hydrochlorothiazide, Metformin, Spironolactone, Telithromycin, and Ace inhibitors  Review of Systems   Review of Systems  Constitutional: Negative for chills and fever.  HENT: Positive for facial swelling and voice change. Negative for ear pain and sore throat.   Eyes: Negative for pain and visual disturbance.  Respiratory: Negative for cough and shortness of breath.   Cardiovascular: Negative for chest pain and palpitations.  Gastrointestinal: Negative for abdominal pain and vomiting.  Genitourinary: Negative for dysuria and hematuria.  Musculoskeletal: Negative for arthralgias and back pain.  Skin: Negative for color change and rash.  Neurological: Negative for syncope, light-headedness and headaches.  Psychiatric/Behavioral: Negative for agitation and confusion.  All other systems reviewed and are negative.   Physical Exam Updated Vital Signs BP (!) 197/84   Pulse 71   Temp 98.2 F (36.8 C) (Oral)   Resp 18   Ht 5\' 4"  (1.626 m)   Wt 73.9 kg   SpO2 100%   BMI 27.98 kg/m   Physical Exam Vitals and nursing note reviewed.  Constitutional:      General: She is not in acute distress.    Appearance: She is well-developed.  HENT:     Head: Normocephalic and atraumatic.     Mouth/Throat:      Comments: Uniform moderate tongue swelling, Mallempati score 3 Oropharynx non-erythematous.  No tonsillar swelling or exudate.  No uvular deviation.  No drooling. No brawny edema. No stridor. Eyes:     Conjunctiva/sclera: Conjunctivae normal.     Pupils: Pupils are equal, round, and reactive to light.  Cardiovascular:     Rate and Rhythm: Normal rate and regular rhythm.     Pulses: Normal pulses.  Pulmonary:     Effort: Pulmonary effort is normal. No respiratory distress.     Breath sounds: Normal breath sounds.  Musculoskeletal:     Cervical back: Neck supple.  Skin:    General: Skin is warm and dry.  Neurological:     Mental Status: She is alert.  Psychiatric:        Mood and Affect: Mood normal.  Behavior: Behavior normal.     ED Results / Procedures / Treatments   Labs (all labs ordered are listed, but only abnormal results are displayed) Labs Reviewed  CBG MONITORING, ED - Abnormal; Notable for the following components:      Result Value   Glucose-Capillary 159 (*)    All other components within normal limits    EKG None  Radiology No results found.  Procedures Procedures (including critical care time)  Medications Ordered in ED Medications  diphenhydrAMINE (BENADRYL) injection 25 mg (25 mg Intravenous Given 10/19/20 0758)  dexamethasone (DECADRON) injection 10 mg (10 mg Intravenous Given 10/19/20 0759)  famotidine (PEPCID) IVPB 20 mg premix (0 mg Intravenous Stopped 10/19/20 0832)  hydrALAZINE (APRESOLINE) injection 10 mg (10 mg Intravenous Given 10/19/20 0758)    ED Course  I have reviewed the triage vital signs and the nursing notes.  Pertinent labs & imaging results that were available during my care of the patient were reviewed by me and considered in my medical decision making (see chart for details).  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  She is now 4 hours out from onset of her symptoms.  We'll continue to monitor her here.  On prior admission her symptoms had resolved within 12 hours.  She is hypertensive here.  Would not give more PO meds due to tongue swelling - we'll try IV hydralazine 10 mg, in an effort to stave off further significant elevations in BP, which may be due to discomfort.    Clinical Course as of Oct 20 1843  Sat Oct 19, 2020  0906 Bp improved, no further swelling on reassessment, pt comfortable   [MT]  1117 Tongue swelling improved, no posterior airway swelling or stridor.  Will continue to monitor   [MT]  1307 Swelling improved, nearly back to baseline, only mild edema at tongue tip.  Okay for discharge.  Advised benadryl for next 2 days.  Decadron given here - I would not put her on more steroids at home, esp given she is diabetic.  Return precautions given.   [MT]    Clinical Course User Index [MT] Chari Parmenter, Carola Rhine, MD    Final Clinical Impression(s) / ED Diagnoses Final diagnoses:  Angioedema, initial encounter    Rx / DC Orders ED Discharge Orders    None       Langston Masker Carola Rhine, MD 10/19/20 1844

## 2020-10-19 NOTE — ED Triage Notes (Signed)
She woke up with her tongue swollen.

## 2020-10-19 NOTE — ED Notes (Signed)
She remains in no distress and is breathing normally. She tells me her tongue feels "smaller--it's better than it was when I came in".

## 2021-04-21 ENCOUNTER — Encounter (HOSPITAL_BASED_OUTPATIENT_CLINIC_OR_DEPARTMENT_OTHER): Payer: Self-pay

## 2021-04-21 ENCOUNTER — Emergency Department (HOSPITAL_BASED_OUTPATIENT_CLINIC_OR_DEPARTMENT_OTHER)
Admission: EM | Admit: 2021-04-21 | Discharge: 2021-04-21 | Disposition: A | Discharge status: Home / Self Care | Payer: Medicare HMO | Attending: Emergency Medicine | Admitting: Emergency Medicine

## 2021-04-21 ENCOUNTER — Other Ambulatory Visit: Payer: Self-pay

## 2021-04-21 ENCOUNTER — Emergency Department (HOSPITAL_BASED_OUTPATIENT_CLINIC_OR_DEPARTMENT_OTHER): Payer: Medicare HMO

## 2021-04-21 DIAGNOSIS — I10 Essential (primary) hypertension: Secondary | ICD-10-CM | POA: Diagnosis not present

## 2021-04-21 DIAGNOSIS — Z7982 Long term (current) use of aspirin: Secondary | ICD-10-CM | POA: Insufficient documentation

## 2021-04-21 DIAGNOSIS — Z7984 Long term (current) use of oral hypoglycemic drugs: Secondary | ICD-10-CM | POA: Diagnosis not present

## 2021-04-21 DIAGNOSIS — E119 Type 2 diabetes mellitus without complications: Secondary | ICD-10-CM | POA: Diagnosis not present

## 2021-04-21 DIAGNOSIS — R519 Headache, unspecified: Secondary | ICD-10-CM

## 2021-04-21 DIAGNOSIS — Z79899 Other long term (current) drug therapy: Secondary | ICD-10-CM | POA: Diagnosis not present

## 2021-04-21 LAB — CBC WITH DIFFERENTIAL/PLATELET
Abs Immature Granulocytes: 0.03 10*3/uL (ref 0.00–0.07)
Basophils Absolute: 0 10*3/uL (ref 0.0–0.1)
Basophils Relative: 0 %
Eosinophils Absolute: 0 10*3/uL (ref 0.0–0.5)
Eosinophils Relative: 1 %
HCT: 40.1 % (ref 36.0–46.0)
Hemoglobin: 13.6 g/dL (ref 12.0–15.0)
Immature Granulocytes: 0 %
Lymphocytes Relative: 38 %
Lymphs Abs: 2.7 10*3/uL (ref 0.7–4.0)
MCH: 31.4 pg (ref 26.0–34.0)
MCHC: 33.9 g/dL (ref 30.0–36.0)
MCV: 92.6 fL (ref 80.0–100.0)
Monocytes Absolute: 0.6 10*3/uL (ref 0.1–1.0)
Monocytes Relative: 9 %
Neutro Abs: 3.7 10*3/uL (ref 1.7–7.7)
Neutrophils Relative %: 52 %
Platelets: 242 10*3/uL (ref 150–400)
RBC: 4.33 MIL/uL (ref 3.87–5.11)
RDW: 13.5 % (ref 11.5–15.5)
WBC: 7.1 10*3/uL (ref 4.0–10.5)
nRBC: 0 % (ref 0.0–0.2)

## 2021-04-21 LAB — COMPREHENSIVE METABOLIC PANEL
ALT: 22 U/L (ref 0–44)
AST: 29 U/L (ref 15–41)
Albumin: 4.1 g/dL (ref 3.5–5.0)
Alkaline Phosphatase: 40 U/L (ref 38–126)
Anion gap: 13 (ref 5–15)
BUN: 32 mg/dL — ABNORMAL HIGH (ref 8–23)
CO2: 26 mmol/L (ref 22–32)
Calcium: 10 mg/dL (ref 8.9–10.3)
Chloride: 96 mmol/L — ABNORMAL LOW (ref 98–111)
Creatinine, Ser: 1.67 mg/dL — ABNORMAL HIGH (ref 0.44–1.00)
GFR, Estimated: 31 mL/min — ABNORMAL LOW (ref >60)
Glucose, Bld: 155 mg/dL — ABNORMAL HIGH (ref 70–99)
Potassium: 3.1 mmol/L — ABNORMAL LOW (ref 3.5–5.1)
Sodium: 135 mmol/L (ref 135–145)
Total Bilirubin: 0.4 mg/dL (ref 0.3–1.2)
Total Protein: 7.8 g/dL (ref 6.5–8.1)

## 2021-04-21 MED ORDER — METOCLOPRAMIDE HCL 5 MG/ML IJ SOLN
10.0000 mg | Freq: Once | INTRAMUSCULAR | Status: DC
  Filled 2021-04-21: qty 2

## 2021-04-21 MED ORDER — HYDRALAZINE HCL 25 MG PO TABS
50.0000 mg | ORAL_TABLET | Freq: Once | ORAL | Status: AC
  Administered 2021-04-21: 50 mg via ORAL
  Filled 2021-04-21: qty 2

## 2021-04-21 NOTE — ED Provider Notes (Signed)
[facility] EMERGENCY DEPARTMENT Provider Note   CSN: [id number] Arrival date & time: 04/21/21  1351     History Chief Complaint  Patient presents with  . Headache    Angie Barrett is a 80 y.o. female with a history of hypertension, diabetes, and high cholesterol.  Patient presents with chief complaint of headache.  Patient reports that headache started 2 to 3 hours ago.  Patient states that headache is a band across her forehead and extends to right parietal aspect of head.  Patient rates pain 8/10 on the pain scale.  Patient states headache had gradual onset and has gotten progressively worse over time.  Patient denies any alleviating or aggravating factors.  Patient denies any recent falls or injuries.  Patient also endorses dizziness when standing.  Patient denies any chest pain, shortness of breath, facial symmetry, slurred speech, numbness, weakness, visual disturbance, neck stiffness, neck pain, fevers, chills.  Patient reports that her blood pressure has been high today.  Patient has history of hypertension.  Patient takes 50 mg of hydralazine 3 times daily.  This medication was increased from 25 mg 3 times daily last week due to uncontrolled hypertension.  Patient states that she has been taking her medication as prescribed and took this medication this morning.  HPI     Past Medical History:  Diagnosis Date  . Diabetes mellitus without complication ([place])   . High cholesterol   . Hypertension     Patient Active Problem List   Diagnosis Date Noted  . 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 LEG SURGERY       OB History   No obstetric history on file.     No family history on file.  Social History   Tobacco Use  . Smoking status: Never Smoker  . Smokeless tobacco: Never Used  Vaping Use  . Vaping Use: Never used  Substance Use Topics  . Alcohol use: Not Currently  . Drug use: Never    Home Medications Prior  to Admission medications   Medication Sig Start Date End Date Taking? Authorizing Provider  acetaminophen (TYLENOL) 500 MG tablet Take 2 tablets (1,000 mg total) by mouth every 6 (six) hours as needed. 05/22/18   Charlesetta Shanks, MD  aspirin 81 MG tablet Take 81 mg by mouth daily.    [provider]  calcium carbonate (OSCAL) 1500 (600 Ca) MG TABS tablet Take 600 mg of elemental calcium by mouth 2 (two) times daily with a meal.    [provider]  cholecalciferol (VITAMIN D3) 25 MCG (1000 UNIT) tablet Take 2,000 Units by mouth daily.    [provider]  conjugated estrogens (PREMARIN) vaginal cream Place 1 Applicatorful vaginally 2 (two) times a week.    [provider]  EPINEPHrine 0.3 mg/0.3 mL IJ SOAJ injection Inject 0.3 mg into the muscle as needed for anaphylaxis. 08/27/20   Alexandria Lodge, MD  fluticasone (FLONASE) 50 MCG/ACT nasal spray Place 2 sprays into both nostrils daily as needed for allergies or rhinitis.     [provider]  furosemide (LASIX) 20 MG tablet Take 20 mg by mouth 2 (two) times daily.    [provider]  linagliptin (TRADJENTA) 5 MG TABS tablet Take 5 mg by mouth daily.    [provider]  meclizine (ANTIVERT) 25 MG tablet Take 1 tablet (25 mg total) by mouth 3 (three) times daily as needed. Patient not taking: Reported on 08/25/2020  06/23/13   Truddie Hidden, MD  metoprolol succinate (TOPROL-XL) 25 MG 24 hr tablet Take 25 mg by mouth daily.    [provider]  NIFEdipine (PROCARDIA XL/NIFEDICAL-XL) 90 MG 24 hr tablet Take 90 mg by mouth daily.    [provider]  omeprazole (PRILOSEC) 40 MG capsule Take 40 mg by mouth daily.    [provider]  pioglitazone (ACTOS) 15 MG tablet Take 15 mg by mouth daily.    [provider]  PRAVASTATIN SODIUM PO Take 80 mg by mouth daily.     [provider]  Teriparatide, Recombinant, 620 MCG/2.48ML SOPN Inject 20 mcg into the  skin daily. 08/27/20   Alexandria Lodge, MD    Allergies    Amoxicillin-pot clavulanate, Ciprofloxacin, Clotrimazole, Nystatin, Olmesartan, Hydrochlorothiazide, Metformin, Spironolactone, Telithromycin, and Ace inhibitors  Review of Systems   Review of Systems  Constitutional: Negative for chills and fever.  Eyes: Negative for visual disturbance.  Respiratory: Negative for shortness of breath.   Cardiovascular: Negative for chest pain.  Gastrointestinal: Negative for abdominal pain, nausea and vomiting.  Genitourinary: Negative for difficulty urinating and dysuria.  Musculoskeletal: Negative for back pain, neck pain and neck stiffness.  Skin: Negative for color change and rash.  Neurological: Positive for dizziness. Negative for tremors, seizures, syncope, facial asymmetry, speech difficulty, weakness, light-headedness, numbness and headaches.  Psychiatric/Behavioral: Negative for confusion.    Physical Exam Updated Vital Signs BP (!) 192/58 (BP Location: Right Arm)   Pulse 87   Temp 97.8 F (36.6 C) (Oral)   Resp 16   Ht '5\' 4"'$  (1.626 m)   Wt 73.9 kg   SpO2 98%   BMI 27.98 kg/m   Physical Exam Vitals and nursing note reviewed.  Constitutional:      General: She is not in acute distress.    Appearance: She is not ill-appearing, toxic-appearing or diaphoretic.  HENT:     Head: Normocephalic and atraumatic.  Eyes:     General: No scleral icterus.       Right eye: No discharge.        Left eye: No discharge.     Extraocular Movements: Extraocular movements intact.     Pupils: Pupils are equal, round, and reactive to light.  Cardiovascular:     Rate and Rhythm: Normal rate.  Pulmonary:     Effort: Pulmonary effort is normal. No tachypnea, bradypnea or respiratory distress.     Breath sounds: Normal breath sounds. No stridor.  Abdominal:     General: There is no distension. There are no signs of injury.     Palpations: Abdomen is soft. There is no mass or pulsatile mass.      Tenderness: There is no abdominal tenderness. There is no guarding or rebound.  Musculoskeletal:     Cervical back: Normal range of motion and neck supple. No edema, erythema, signs of trauma, rigidity, torticollis or crepitus. No pain with movement, spinous process tenderness or muscular tenderness. Normal range of motion.  Skin:    General: Skin is warm and dry.  Neurological:     General: No focal deficit present.     Mental Status: She is alert and oriented to person, place, and time.     GCS: GCS eye subscore is 4. GCS verbal subscore is 5. GCS motor subscore is 6.     Cranial Nerves: No cranial nerve deficit or facial asymmetry.     Sensory: Sensation is intact.     Motor: No weakness,  tremor, seizure activity or pronator drift.     Coordination: Finger-Nose-Finger Test normal.     Gait: Gait is intact. Gait normal.     Comments: CN II-XII intact, equal grip strength, +5 strength to bilateral upper and lower extremities, station to light touch intact to bilateral upper and lower extremities    Psychiatric:        Behavior: Behavior is cooperative.     ED Results / Procedures / Treatments   Labs (all labs ordered are listed, but only abnormal results are displayed) Labs Reviewed  COMPREHENSIVE METABOLIC PANEL - Abnormal; Notable for the following components:      Result Value   Potassium 3.1 (*)    Chloride 96 (*)    Glucose, Bld 155 (*)    BUN 32 (*)    Creatinine, Ser 1.67 (*)    GFR, Estimated 31 (*)    All other components within normal limits  CBC WITH DIFFERENTIAL/PLATELET    EKG EKG Interpretation  Date/Time:  Monday April 21 2021 14:25:07 EDT Ventricular Rate:  78 PR Interval:  159 QRS Duration: 106 QT Interval:  439 QTC Calculation: 501 R Axis:   -35 Text Interpretation: Sinus rhythm Left axis deviation Probable anteroseptal infarct, old Borderline ST depression, anterolateral leads Prolonged QT interval No significant change since prior 10/21  Confirmed by Aletta Edouard [phone]) on 04/21/2021 2:32:50 PM   Radiology CT Head Wo Contrast  Result Date: 04/21/2021 CLINICAL DATA:  Hypertension and headache since this morning. EXAM: CT HEAD WITHOUT CONTRAST TECHNIQUE: Contiguous axial images were obtained from the base of the skull through the vertex without intravenous contrast. COMPARISON:  Head CT 08/16/2019 FINDINGS: Brain: Stable age related cerebral atrophy, ventriculomegaly and periventricular white matter disease. No extra-axial fluid collections are identified. No CT findings for acute hemispheric infarction or intracranial hemorrhage. No mass lesions. The brainstem and cerebellum are normal. Vascular: Stable vascular calcifications. No aneurysm or hyperdense vessels. Skull: No skull fracture or bone lesions. Sinuses/Orbits: The paranasal sinuses and mastoid air cells are clear. The globes are intact. Other: No scalp lesions or scalp hematoma. IMPRESSION: 1. Stable age related cerebral atrophy, ventriculomegaly and periventricular white matter disease. 2. No acute intracranial findings or mass lesions. Electronically Signed   By: Marijo Sanes M.D.   On: 04/21/2021 15:18    Procedures Procedures   Medications Ordered in ED Medications  hydrALAZINE (APRESOLINE) tablet 50 mg (50 mg Oral Given 04/21/21 1537)    ED Course  I have reviewed the triage vital signs and the nursing notes.  Pertinent labs & imaging results that were available during my care of the patient were reviewed by me and considered in my medical decision making (see chart for details).    MDM Rules/Calculators/A&P                          Alert 80 year old female no acute distress, nontoxic-appearing.  Patient presents with a chief complaint of headache and hypertension.  Patient has a history of hypertension and takes hydralazine 50 mg 3 times daily, as well as metoprolol 25 mg once daily.  She reports that she has been taking her medication as prescribed.  Patient  had 1 dose of medication this morning.  Patient recently had an increase in her medication due to uncontrolled hypertension.  Patient denies any chest pain, shortness of breath, visual disturbance.  Patient reports that headache began 2 to 3 hours prior to arrival.  Headache was gradual  in onset and has gotten progressively worse.  Patient denies any numbness, weakness, slurred speech, facial symmetry.    Physical exam patient has no focal neurological deficits.  Lungs clear to auscultation bilaterally.  Patient able speak in full complete sentences without difficulty.  Will obtain noncontrast head CT to evaluate for possible head bleed.  We will also obtain EKG, CMP and CBC.  Noncontrast head CT shows no acute intracranial findings or mass lesions. CBC is unremarkable. CMP shows increased creatinine and BUN however appears to be patient's baseline.  Potassium slightly decreased at 3.1.  Will give patient home dose of hydralazine.  On repeat examination patient reports improvement in her headache.  Patient rates pain 4/10 on the pain scale.  Patient's blood pressure noted to be improved at 150/51.  Patient's blood pressure continue to improve throughout ED stay.  Patient reports complete resolution of her headache.  Patient hemodynamically stable.  Will discharge patient at this time to follow-up with primary care provider for further hypertension management.  Discussed results, findings, treatment and follow up. Patient advised of return precautions. Patient verbalized understanding and agreed with plan.  Patient was discussed with and evaluated by Dr. Ayesha Rumpf.   Final Clinical Impression(s) / ED Diagnoses Final diagnoses:  Hypertension, unspecified type  Acute nonintractable headache, unspecified headache type    Rx / DC Orders ED Discharge Orders    None       Dyann Ruddle 04/21/21 1712    Quintella Reichert, MD 04/24/21 [phone]

## 2021-04-21 NOTE — ED Triage Notes (Signed)
Pt arrives ambulatory to ED with c/o headache starting around 11 am today states that her BP has also been elevated.

## 2021-04-21 NOTE — ED Notes (Signed)
Provided EKG results to MD Melina Copa at approximately 1426. RN aware that EKG completed.

## 2021-04-21 NOTE — Discharge Instructions (Addendum)
You came to the emergency department today to be evaluated for your headache.  Your physical exam was very reassuring.  Your CT scan showed no acute findings.  You received your hydralazine medication.  He had no improvement in her headache and her blood pressure.  Please follow-up with your primary care provider for further hypertension management.  Please takeTylenol (acetaminophen) to relieve your pain.  You make take tylenol, up to 1,000 mg (two extra strength pills) every 8 hours as needed.    Do not take more than 3,000 mg tylenol in a 24 hour period (not more than one dose every 8 hours.  Please check all medication labels as many medications such as pain and cold medications may contain tylenol.  Do not drink alcohol while taking these medications.    Get help right away if you: Develop a severe headache or confusion. Have unusual weakness or numbness. Feel faint. Have severe pain in your chest or abdomen. Vomit repeatedly. Have trouble breathing.

## 2021-11-02 IMAGING — DX DG CHEST 1V PORT
1 series · 1 of 1 positions shown · non-contrast
Comparison: 08/29/2019 from [REDACTED]

CLINICAL DATA: Allergic reaction.  Tongue swelling.

EXAM:
PORTABLE CHEST 1 VIEW

[chest ap]
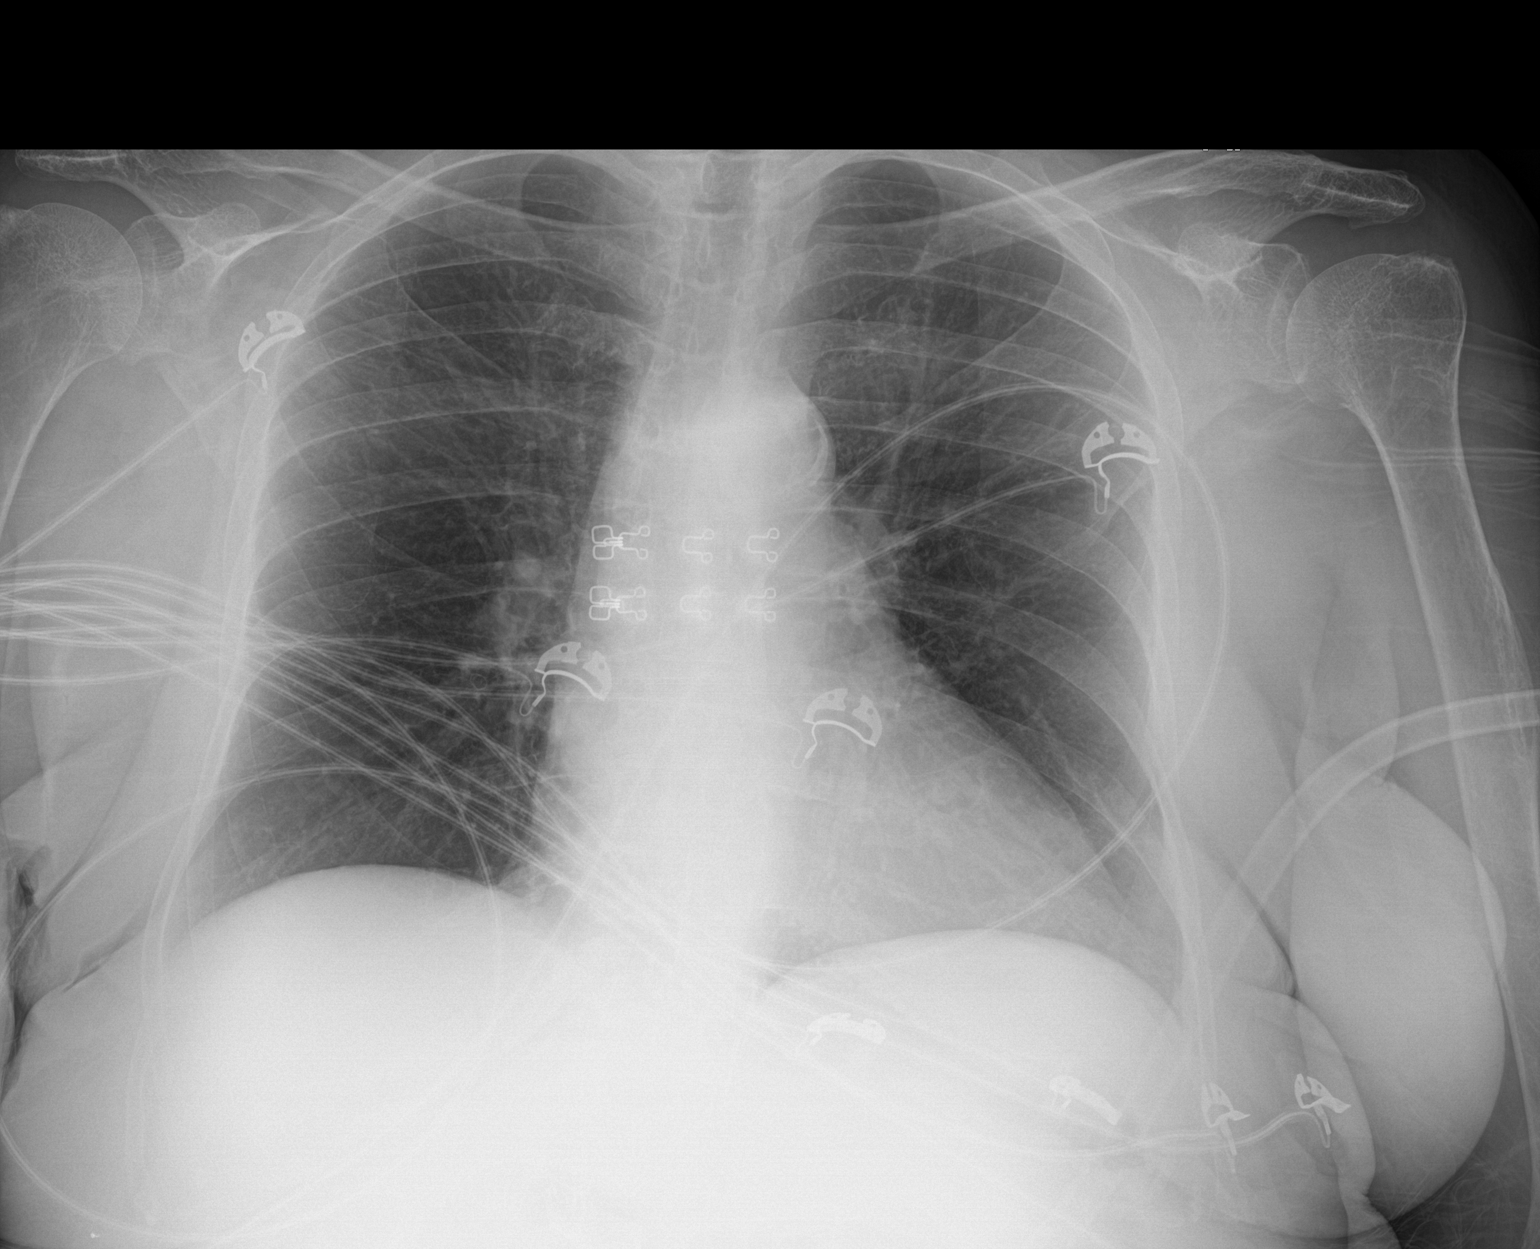

[1 of 1 positions shown; findings below may reference images not displayed]

FINDINGS: Numerous leads and wires project over the chest. Midline trachea.
Borderline cardiomegaly. Atherosclerosis in the transverse aorta. No
pleural effusion or pneumothorax. Clear lungs.
IMPRESSION: No acute cardiopulmonary disease.

Aortic Atherosclerosis ([id number]).

## 2021-11-17 ENCOUNTER — Ambulatory Visit
Admission: EM | Admit: 2021-11-17 | Discharge: 2021-11-17 | Disposition: A | Discharge status: Home / Self Care | Payer: Medicare HMO

## 2021-11-17 ENCOUNTER — Other Ambulatory Visit: Payer: Self-pay

## 2021-11-17 DIAGNOSIS — R0982 Postnasal drip: Secondary | ICD-10-CM

## 2021-11-17 DIAGNOSIS — J3489 Other specified disorders of nose and nasal sinuses: Secondary | ICD-10-CM | POA: Diagnosis not present

## 2021-11-17 DIAGNOSIS — R058 Other specified cough: Secondary | ICD-10-CM

## 2021-11-17 MED ORDER — IPRATROPIUM BROMIDE 0.06 % NA SOLN: 2.0000 | Freq: Four times a day (QID) | NASAL | 0 refills | Status: DC

## 2021-11-17 NOTE — ED Triage Notes (Signed)
Pt reports of having headaches, cough, sore throat and congestion Started: last night

## 2021-11-17 NOTE — ED Provider Notes (Signed)
[facility]    CSN: [id number] Arrival date & time: 11/17/21  1217    HISTORY  No chief complaint on file.  HPI Angie Barrett is a 81 y.o. female. Patient states that last night she began to have headache, cough, sore throat and congestion.  Vital signs are normal on arrival today.  Patient states she not tried any medications for this, states she did not know what to take.  Patient states her cough is nonproductive, she coughs because she has a tickle in the back of her throat from postnasal drip.  Patient states she is also had a clear runny nose.  Patient denies sick contacts.  The history is provided by the patient.  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    Home Medications    Prior to Admission medications   Medication Sig Start Date End Date Taking? Authorizing Provider  acetaminophen (TYLENOL) 500 MG tablet Take 2 tablets (1,000 mg total) by mouth every 6 (six) hours as needed. 05/22/18   Charlesetta Shanks, MD  calcium carbonate (OSCAL) 1500 (600 Ca) MG TABS tablet Take 600 mg of elemental calcium by mouth 2 (two) times daily with a meal.    [provider]  cholecalciferol (VITAMIN D3) 25 MCG (1000 UNIT) tablet Take 2,000 Units by mouth daily.    [provider]  conjugated estrogens (PREMARIN) vaginal cream Place 1 Applicatorful vaginally 2 (two) times a week.    [provider]  EPINEPHrine 0.3 mg/0.3 mL IJ SOAJ injection Inject 0.3 mg into the muscle as needed for anaphylaxis. 08/27/20   Alexandria Lodge, MD  fluticasone (FLONASE) 50 MCG/ACT nasal spray Place 2 sprays into both nostrils daily as needed for allergies or rhinitis.     [provider]   linagliptin (TRADJENTA) 5 MG TABS tablet Take 5 mg by mouth daily.    [provider]  metoprolol succinate (TOPROL-XL) 25 MG 24 hr tablet Take 25 mg by mouth daily.    [provider]  NIFEdipine (PROCARDIA XL/NIFEDICAL-XL) 90 MG 24 hr tablet Take 90 mg by mouth daily.    [provider]  omeprazole (PRILOSEC) 40 MG capsule Take 40 mg by mouth daily.    [provider]  pioglitazone (ACTOS) 15 MG tablet Take 15 mg by mouth daily.    [provider]  PRAVASTATIN SODIUM PO Take 80 mg by mouth daily.     [provider]  Teriparatide, Recombinant, 620 MCG/2.48ML SOPN Inject 20 mcg into the skin daily. 08/27/20   Alexandria Lodge, MD   Family History History reviewed. No pertinent family history. Social History Social History   Tobacco Use   Smoking status: Never   Smokeless tobacco: Never  Vaping Use   Vaping Use: Never used  Substance Use Topics   Alcohol use: Not Currently   Drug use: Never   Allergies   Amoxicillin-pot clavulanate, Ciprofloxacin, Clotrimazole, Nystatin, Olmesartan, Hydrochlorothiazide, Metformin, Spironolactone, Telithromycin, and Ace inhibitors  Review of Systems Review of Systems Pertinent findings noted in history of present illness.   Physical Exam Triage Vital Signs ED Triage Vitals  Enc Vitals Group     BP 09/12/21 0827 Marland Kitchen)  147/82     Pulse Rate 09/12/21 0827 72     Resp 09/12/21 0827 18     Temp 09/12/21 0827 98.3 F (36.8 C)     Temp Source 09/12/21 0827 Oral     SpO2 09/12/21 0827 98 %     Weight --      Height --      Head Circumference --      Peak Flow --      Pain Score 09/12/21 0826 5     Pain Loc --      Pain Edu? --      Excl. in [place]? --   No data found.  Updated Vital Signs BP 137/66 (BP Location: Right Arm)    Pulse 64    Temp 98.8 F (37.1 C) (Oral)    Resp 18    SpO2 98%   Physical Exam Vitals and nursing note reviewed.  Constitutional:      General: She is not in acute  distress.    Appearance: Normal appearance. She is not ill-appearing.  HENT:     Head: Normocephalic and atraumatic.     Salivary Glands: Right salivary gland is not diffusely enlarged or tender. Left salivary gland is not diffusely enlarged or tender.     Right Ear: Tympanic membrane, ear canal and external ear normal. No drainage. No middle ear effusion. There is no impacted cerumen. Tympanic membrane is not erythematous or bulging.     Left Ear: Tympanic membrane, ear canal and external ear normal. No drainage.  No middle ear effusion. There is no impacted cerumen. Tympanic membrane is not erythematous or bulging.     Nose: Nose normal. No nasal deformity, septal deviation, mucosal edema, congestion or rhinorrhea.     Right Turbinates: Not enlarged, swollen or pale.     Left Turbinates: Not enlarged, swollen or pale.     Right Sinus: No maxillary sinus tenderness or frontal sinus tenderness.     Left Sinus: No maxillary sinus tenderness or frontal sinus tenderness.     Mouth/Throat:     Lips: Pink. No lesions.     Mouth: Mucous membranes are moist. No oral lesions.     Pharynx: Oropharynx is clear. Uvula midline. No posterior oropharyngeal erythema or uvula swelling.     Tonsils: No tonsillar exudate. 0 on the right. 0 on the left.  Eyes:     General: Lids are normal.        Right eye: No discharge.        Left eye: No discharge.     Extraocular Movements: Extraocular movements intact.     Conjunctiva/sclera: Conjunctivae normal.     Right eye: Right conjunctiva is not injected.     Left eye: Left conjunctiva is not injected.  Neck:     Trachea: Trachea and phonation normal.  Cardiovascular:     Rate and Rhythm: Normal rate and regular rhythm.     Pulses: Normal pulses.     Heart sounds: Normal heart sounds. No murmur heard.   No friction rub. No gallop.  Pulmonary:     Effort: Pulmonary effort is normal. No accessory muscle usage, prolonged expiration or respiratory distress.      Breath sounds: Normal breath sounds. No stridor, decreased air movement or transmitted upper airway sounds. No decreased breath sounds, wheezing, rhonchi or rales.  Chest:     Chest wall: No tenderness.  Musculoskeletal:        General: Normal range of motion.  Cervical back: Normal range of motion and neck supple. Normal range of motion.  Lymphadenopathy:     Cervical: No cervical adenopathy.  Skin:    General: Skin is warm and dry.     Findings: No erythema or rash.  Neurological:     General: No focal deficit present.     Mental Status: She is alert and oriented to person, place, and time.  Psychiatric:        Mood and Affect: Mood normal.        Behavior: Behavior normal.    Visual Acuity Right Eye Distance:   Left Eye Distance:   Bilateral Distance:    Right Eye Near:   Left Eye Near:    Bilateral Near:     UC Couse / Diagnostics / Procedures:    EKG  Radiology No results found.  Procedures Procedures (including critical care time)  UC Diagnoses / Final Clinical Impressions(s)   I have reviewed the triage vital signs and the nursing notes.  Pertinent labs & imaging results that were available during my care of the patient were reviewed by me and considered in my medical decision making (see chart for details).   Final diagnoses:  Rhinorrhea  Postnasal drip  Non-productive cough   Physical exam is entirely unremarkable today.  We will provide patient with a prescription for ipratropium nasal spray in the hopes that this opens up some of her nasal congestion and dries up mucous membranes so that she will not have a post nasal drip/cough anymore.  Return precautions advised.  ED Prescriptions     Medication Sig Dispense Auth. Provider   ipratropium (ATROVENT) 0.06 % nasal spray Place 2 sprays into both nostrils 4 (four) times daily. As needed for nasal congestion, runny nose 15 mL Lynden Oxford Scales, PA-C      PDMP not reviewed this  encounter.  Pending results:  Labs Reviewed - No data to display  Medications Ordered in UC: Medications - No data to display  Disposition Upon Discharge:  Condition: stable for discharge home Home: take medications as prescribed; routine discharge instructions as discussed; follow up as advised.  Patient presented with an acute illness with associated systemic symptoms and significant discomfort requiring urgent management. In my opinion, this is a condition that a prudent lay person (someone who possesses an average knowledge of health and medicine) may potentially expect to result in complications if not addressed urgently such as respiratory distress, impairment of bodily function or dysfunction of bodily organs.   Routine symptom specific, illness specific and/or disease specific instructions were discussed with the patient and/or caregiver at length.   As such, the patient has been evaluated and assessed, work-up was performed and treatment was provided in alignment with urgent care protocols and evidence based medicine.  Patient/parent/caregiver has been advised that the patient may require follow up for further testing and treatment if the symptoms continue in spite of treatment, as clinically indicated and appropriate.  If the patient was tested for COVID-19, Influenza and/or RSV, then the patient/parent/guardian was advised to isolate at home pending the results of his/her diagnostic coronavirus test and potentially longer if theyre positive. I have also advised pt that if his/her COVID-19 test returns positive, it's recommended to self-isolate for at least 10 days after symptoms first appeared AND until fever-free for 24 hours without fever reducer AND other symptoms have improved or resolved. Discussed self-isolation recommendations as well as instructions for household member/close contacts as per the CDC and [place]  DHHS, and also gave patient the COVID packet with this  information.  Patient/parent/caregiver has been advised to [facility] or PCP in 3-5 days if no better; to PCP or the Emergency Department if new signs and symptoms develop, or if the current signs or symptoms continue to change or worsen for further workup, evaluation and treatment as clinically indicated and appropriate  The patient will follow up with their current PCP if and as advised. If the patient does not currently have a PCP we will assist them in obtaining one.   The patient may need specialty follow up if the symptoms continue, in spite of conservative treatment and management, for further workup, evaluation, consultation and treatment as clinically indicated and appropriate.  Patient/parent/caregiver verbalized understanding and agreement of plan as discussed.  All questions were addressed during visit.  Please see discharge instructions below for further details of plan.  Discharge Instructions:   Discharge Instructions      Please begin Atrovent nasal spray to dry up mucous membranes which will hopefully resolve your cough.  Please be sure you follow-up with your primary care provider to let them know that you have had a frequent, dry cough.  At this time, I am not concerned that you are showing any signs or symptoms of COVID or influenza.      This office note has been dictated using Museum/gallery curator.  Unfortunately, and despite my best efforts, this method of dictation can sometimes lead to occasional typographical or grammatical errors.  I apologize in advance if this occurs.     Lynden Oxford Scales, PA-C 11/17/21 1542

## 2021-11-17 NOTE — Discharge Instructions (Addendum)
Please begin Atrovent nasal spray to dry up mucous membranes which will hopefully resolve your cough.  Please be sure you follow-up with your primary care provider to let them know that you have had a frequent, dry cough.  At this time, I am not concerned that you are showing any signs or symptoms of COVID or influenza.

## 2021-12-01 ENCOUNTER — Emergency Department (HOSPITAL_BASED_OUTPATIENT_CLINIC_OR_DEPARTMENT_OTHER)
Admission: EM | Admit: 2021-12-01 | Discharge: 2021-12-02 | Disposition: A | Discharge status: Home / Self Care | Payer: Medicare HMO | Attending: Emergency Medicine | Admitting: Emergency Medicine

## 2021-12-01 ENCOUNTER — Other Ambulatory Visit: Payer: Self-pay

## 2021-12-01 ENCOUNTER — Encounter (HOSPITAL_BASED_OUTPATIENT_CLINIC_OR_DEPARTMENT_OTHER): Payer: Self-pay | Admitting: Radiology

## 2021-12-01 DIAGNOSIS — R778 Other specified abnormalities of plasma proteins: Secondary | ICD-10-CM | POA: Diagnosis not present

## 2021-12-01 DIAGNOSIS — T783XXA Angioneurotic edema, initial encounter: Secondary | ICD-10-CM

## 2021-12-01 DIAGNOSIS — Z79899 Other long term (current) drug therapy: Secondary | ICD-10-CM | POA: Insufficient documentation

## 2021-12-01 DIAGNOSIS — R7989 Other specified abnormal findings of blood chemistry: Secondary | ICD-10-CM

## 2021-12-01 DIAGNOSIS — Z7901 Long term (current) use of anticoagulants: Secondary | ICD-10-CM | POA: Diagnosis not present

## 2021-12-01 DIAGNOSIS — T7840XA Allergy, unspecified, initial encounter: Secondary | ICD-10-CM | POA: Diagnosis present

## 2021-12-01 NOTE — ED Triage Notes (Signed)
Pt states she woke about hour ago noticed her tongue swelling. Pt used her home epi pen. NAD noticed in triage, No difficulty breathing or swallowing. Pt unsure what she is having a reaction to.

## 2021-12-02 ENCOUNTER — Telehealth: Payer: Self-pay

## 2021-12-02 LAB — BASIC METABOLIC PANEL
Anion gap: 12 (ref 5–15)
Anion gap: 12 (ref 5–15)
BUN: 37 mg/dL — ABNORMAL HIGH (ref 8–23)
BUN: 39 mg/dL — ABNORMAL HIGH (ref 8–23)
CO2: 24 mmol/L (ref 22–32)
CO2: 25 mmol/L (ref 22–32)
Calcium: 9.2 mg/dL (ref 8.9–10.3)
Calcium: 9.4 mg/dL (ref 8.9–10.3)
Chloride: 100 mmol/L (ref 98–111)
Chloride: 102 mmol/L (ref 98–111)
Creatinine, Ser: 1.92 mg/dL — ABNORMAL HIGH (ref 0.44–1.00)
Creatinine, Ser: 2.18 mg/dL — ABNORMAL HIGH (ref 0.44–1.00)
GFR, Estimated: 22 mL/min — ABNORMAL LOW (ref >60)
GFR, Estimated: 26 mL/min — ABNORMAL LOW (ref >60)
Glucose, Bld: 177 mg/dL — ABNORMAL HIGH (ref 70–99)
Glucose, Bld: 191 mg/dL — ABNORMAL HIGH (ref 70–99)
Potassium: 3.1 mmol/L — ABNORMAL LOW (ref 3.5–5.1)
Potassium: 3.3 mmol/L — ABNORMAL LOW (ref 3.5–5.1)
Sodium: 137 mmol/L (ref 135–145)
Sodium: 138 mmol/L (ref 135–145)

## 2021-12-02 LAB — CBC WITH DIFFERENTIAL/PLATELET
Abs Immature Granulocytes: 0.04 10*3/uL (ref 0.00–0.07)
Basophils Absolute: 0 10*3/uL (ref 0.0–0.1)
Basophils Relative: 1 %
Eosinophils Absolute: 0.2 10*3/uL (ref 0.0–0.5)
Eosinophils Relative: 2 %
HCT: 34.1 % — ABNORMAL LOW (ref 36.0–46.0)
Hemoglobin: 11.9 g/dL — ABNORMAL LOW (ref 12.0–15.0)
Immature Granulocytes: 1 %
Lymphocytes Relative: 32 %
Lymphs Abs: 2.7 10*3/uL (ref 0.7–4.0)
MCH: 31.8 pg (ref 26.0–34.0)
MCHC: 34.9 g/dL (ref 30.0–36.0)
MCV: 91.2 fL (ref 80.0–100.0)
Monocytes Absolute: 0.5 10*3/uL (ref 0.1–1.0)
Monocytes Relative: 6 %
Neutro Abs: 4.9 10*3/uL (ref 1.7–7.7)
Neutrophils Relative %: 58 %
Platelets: 275 10*3/uL (ref 150–400)
RBC: 3.74 MIL/uL — ABNORMAL LOW (ref 3.87–5.11)
RDW: 13.7 % (ref 11.5–15.5)
WBC: 8.4 10*3/uL (ref 4.0–10.5)
nRBC: 0 % (ref 0.0–0.2)

## 2021-12-02 LAB — TROPONIN I (HIGH SENSITIVITY)
Troponin I (High Sensitivity): 11 ng/L (ref <18)
Troponin I (High Sensitivity): 18 ng/L — ABNORMAL HIGH (ref <18)
Troponin I (High Sensitivity): 25 ng/L — ABNORMAL HIGH (ref <18)
Troponin I (High Sensitivity): 29 ng/L — ABNORMAL HIGH (ref <18)

## 2021-12-02 MED ORDER — SODIUM CHLORIDE 0.9 % IV BOLUS
1000.0000 mL | Freq: Once | INTRAVENOUS | Status: AC
  Administered 2021-12-02: 1000 mL via INTRAVENOUS

## 2021-12-02 MED ORDER — METHYLPREDNISOLONE SODIUM SUCC 125 MG IJ SOLR
125.0000 mg | Freq: Once | INTRAMUSCULAR | Status: AC
  Administered 2021-12-02: 125 mg via INTRAVENOUS
  Filled 2021-12-02: qty 2

## 2021-12-02 MED ORDER — PREDNISONE 50 MG PO TABS: ORAL_TABLET | ORAL | 0 refills | Status: DC

## 2021-12-02 MED ORDER — EPINEPHRINE 0.3 MG/0.3ML IJ SOAJ: 0.3000 mg | INTRAMUSCULAR | 0 refills | Status: DC | PRN

## 2021-12-02 MED ORDER — DIPHENHYDRAMINE HCL 50 MG/ML IJ SOLN
25.0000 mg | Freq: Once | INTRAMUSCULAR | Status: AC
  Administered 2021-12-02: 25 mg via INTRAVENOUS
  Filled 2021-12-02: qty 1

## 2021-12-02 MED ORDER — TRANEXAMIC ACID-NACL 1000-0.7 MG/100ML-% IV SOLN
1000.0000 mg | Freq: Once | INTRAVENOUS | Status: AC
  Administered 2021-12-02: 1000 mg via INTRAVENOUS
  Filled 2021-12-02: qty 100

## 2021-12-02 MED ORDER — POTASSIUM CHLORIDE CRYS ER 20 MEQ PO TBCR
40.0000 meq | EXTENDED_RELEASE_TABLET | Freq: Once | ORAL | Status: AC
  Administered 2021-12-02: 40 meq via ORAL
  Filled 2021-12-02: qty 2

## 2021-12-02 MED ORDER — FAMOTIDINE IN NACL 20-0.9 MG/50ML-% IV SOLN
20.0000 mg | Freq: Once | INTRAVENOUS | Status: AC
  Administered 2021-12-02: 20 mg via INTRAVENOUS
  Filled 2021-12-02: qty 50

## 2021-12-02 NOTE — Discharge Instructions (Addendum)
Follow-up with your doctor for referral to an allergist.  Take the steroids as prescribed.  You may also use Benadryl as needed for itching.  If you use the epinephrine pen you must come to the hospital afterwards to be observed and you did today.  Only use the epinephrine pen for tongue or lip swelling, difficulty breathing, difficulty swallowing.  Your heart enzyme called the troponin is slightly elevated.  This is likely due to epinephrine use.  You should follow-up with your cardiologist or the cardiologist at [facility] to have a stress test and echocardiogram performed. Return to the ED with new or worsening symptoms.

## 2021-12-02 NOTE — Telephone Encounter (Signed)
Ledora Bottcher, PA sent to [place] Triage Pt needs to be seen this week, likely on DOD schedule. Came to ER with angiodema and mild troponin elevation. Will be a new patient.

## 2021-12-02 NOTE — ED Notes (Signed)
ED Provider at bedside. 

## 2021-12-02 NOTE — Telephone Encounter (Signed)
Left detailed message for Pt requesting call back.  She is scheduled to see Dr. Irish Lack 12/03/2021 at 2:40 pm to establish as New Pt following ER visit.  Please advise Pt of appt.

## 2021-12-02 NOTE — ED Provider Notes (Signed)
Angie Barrett Provider Note   CSN: [id number] Arrival date & time: 12/01/21  2349     History  Chief Complaint  Patient presents with   Allergic Reaction    Angie Barrett is a 81 y.o. female.  Patient states she woke up from sleep about 1 hour ago with tongue swelling mostly to the right side.  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  She believes she is reacting to chicken she ate for dinner.  Gave herself epinephrine pen at home by 11 PM and took 25 mg of Benadryl.  States that tongue is mildly better.  Does not have any difficulty breathing or difficulty swallowing.  Does not have any chest pain.  States her heart was fluttering after she gave her self epinephrine but did not have any chest pain. No rash or wheezing. 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  Does not take ACE inhibitor's or ARB's. States new medication Eliquis about 1 month ago but nothing else is changed.  The history is provided by the patient.  Allergic Reaction Presenting symptoms: no rash       Home Medications Prior to Admission medications   Medication Sig Start Date End Date Taking? Authorizing Provider  acetaminophen (TYLENOL) 500 MG tablet Take 2 tablets (1,000 mg total) by mouth every 6 (six) hours as needed. 05/22/18   Charlesetta Shanks, MD  calcium carbonate (OSCAL) 1500 (600 Ca) MG TABS tablet Take 600 mg of elemental calcium by mouth 2 (two) times daily with a meal.    [provider]  cholecalciferol (VITAMIN D3) 25 MCG (1000 UNIT) tablet Take 2,000 Units by mouth daily.    [provider]  conjugated estrogens (PREMARIN) vaginal cream Place 1 Applicatorful vaginally 2 (two) times a week.    [provider]  Continuous Blood Gluc Sensor (DEXCOM G6 SENSOR) MISC See admin instructions. 02/20/21   [provider]  Continuous Blood Gluc Transmit (DEXCOM G6 TRANSMITTER) MISC See  admin instructions. 02/20/21   [provider]  ELIQUIS 2.5 MG TABS tablet Take 2.5 mg by mouth 2 (two) times daily. 11/03/21   [provider]  EPINEPHrine 0.3 mg/0.3 mL IJ SOAJ injection Inject 0.3 mg into the muscle as needed for anaphylaxis. 08/27/20   Alexandria Lodge, MD  fluticasone (FLONASE) 50 MCG/ACT nasal spray Place 2 sprays into both nostrils daily as needed for allergies or rhinitis.     [provider]  ipratropium (ATROVENT) 0.06 % nasal spray Place 2 sprays into both nostrils 4 (four) times daily. As needed for nasal congestion, runny nose 11/17/21   Lynden Oxford Scales, PA-C  linagliptin (TRADJENTA) 5 MG TABS tablet Take 5 mg by mouth daily.    [provider]  metoprolol succinate (TOPROL-XL) 25 MG 24 hr tablet Take 25 mg by mouth daily.    [provider]  NIFEdipine (PROCARDIA XL/NIFEDICAL-XL) 90 MG 24 hr tablet Take 90 mg by mouth daily.    [provider]  omeprazole (PRILOSEC) 40 MG capsule Take 40 mg by mouth daily.    [provider]  pioglitazone (ACTOS) 15 MG tablet Take 15 mg by mouth daily.    [provider]  PRAVASTATIN SODIUM PO Take 80 mg by mouth daily.     [provider]  Teriparatide, Recombinant, 620 MCG/2.48ML SOPN Inject 20 mcg into the skin daily. 08/27/20   Alexandria Lodge, MD  torsemide [facility]) 20  MG tablet Take 20 mg by mouth daily. 10/07/21   [provider]      Allergies    Amoxicillin-pot clavulanate, Ciprofloxacin, Clotrimazole, Nystatin, Olmesartan, Hydrochlorothiazide, Metformin, Spironolactone, Telithromycin, and Ace inhibitors    Review of Systems   Review of Systems  Constitutional:  Negative for activity change, appetite change and fever.  HENT:  Negative for congestion and rhinorrhea.   Respiratory:  Negative for cough, chest tightness and shortness of breath.   Cardiovascular:  Negative for chest pain.  Gastrointestinal:  Negative for abdominal pain,  nausea and vomiting.  Genitourinary:  Negative for dysuria and hematuria.  Musculoskeletal:  Negative for arthralgias and myalgias.  Skin:  Negative for rash.  Neurological:  Negative for dizziness, weakness and headaches.   all other systems are negative except as noted in the HPI and PMH.   Physical Exam Updated Vital Signs BP (!) 170/74    Pulse 60    Temp 98 F (36.7 C) (Oral)    Resp 17    Ht 5\' 4"  (1.626 m)    Wt 73.9 kg    SpO2 93%    BMI 27.98 kg/m  Physical Exam Vitals and nursing note reviewed.  Constitutional:      General: She is not in acute distress.    Appearance: She is well-developed.  HENT:     Head: Normocephalic and atraumatic.     Mouth/Throat:     Pharynx: No oropharyngeal exudate.     Comments: Swelling to her tongue right greater than left.  Floor mouth is soft.  No posterior pharyngeal swelling. No wheezing Eyes:     Conjunctiva/sclera: Conjunctivae normal.     Pupils: Pupils are equal, round, and reactive to light.  Neck:     Comments: No meningismus. Cardiovascular:     Rate and Rhythm: Normal rate and regular rhythm.     Heart sounds: Normal heart sounds. No murmur heard. Pulmonary:     Effort: Pulmonary effort is normal. No respiratory distress.     Breath sounds: Normal breath sounds.  Chest:     Chest wall: No tenderness.  Abdominal:     Palpations: Abdomen is soft.     Tenderness: There is no abdominal tenderness. There is no guarding or rebound.  Musculoskeletal:        General: No tenderness. Normal range of motion.     Cervical back: Normal range of motion and neck supple.  Skin:    General: Skin is warm.  Neurological:     Mental Status: She is alert and oriented to person, place, and time.     Cranial Nerves: No cranial nerve deficit.     Motor: No abnormal muscle tone.     Coordination: Coordination normal.     Comments:  5/5 strength throughout. CN 2-12 intact.Equal grip strength.   Psychiatric:        Behavior: Behavior  normal.    ED Results / Procedures / Treatments   Labs (all labs ordered are listed, but only abnormal results are displayed) Labs Reviewed  CBC WITH DIFFERENTIAL/PLATELET - Abnormal; Notable for the following components:      Result Value   RBC 3.74 (*)    Hemoglobin 11.9 (*)    HCT 34.1 (*)    All other components within normal limits  BASIC METABOLIC PANEL - Abnormal; Notable for the following components:   Potassium 3.1 (*)    Glucose, Bld 177 (*)    BUN 39 (*)    Creatinine,  Ser 2.18 (*)    GFR, Estimated 22 (*)    All other components within normal limits  BASIC METABOLIC PANEL - Abnormal; Notable for the following components:   Potassium 3.3 (*)    Glucose, Bld 191 (*)    BUN 37 (*)    Creatinine, Ser 1.92 (*)    GFR, Estimated 26 (*)    All other components within normal limits  TROPONIN I (HIGH SENSITIVITY) - Abnormal; Notable for the following components:   Troponin I (High Sensitivity) 18 (*)    All other components within normal limits  TROPONIN I (HIGH SENSITIVITY) - Abnormal; Notable for the following components:   Troponin I (High Sensitivity) 25 (*)    All other components within normal limits  TROPONIN I (HIGH SENSITIVITY) - Abnormal; Notable for the following components:   Troponin I (High Sensitivity) 29 (*)    All other components within normal limits  TROPONIN I (HIGH SENSITIVITY)    EKG EKG Interpretation  Date/Time:  Tuesday December 02 2021 02:22:04 EST Ventricular Rate:  77 PR Interval:  61 QRS Duration: 103 QT Interval:  387 QTC Calculation: 438 R Axis:   -11 Text Interpretation: Sinus rhythm Short PR interval Probable anteroseptal infarct, old Nonspecific repol abnormality, diffuse leads No significant change was found Confirmed by Ezequiel Essex [phone]) on 12/02/2021 2:30:45 AM  Radiology No results found.  Procedures .Critical Care Performed by: Ezequiel Essex, MD Authorized by: Ezequiel Essex, MD   Critical care provider  statement:    Critical care time (minutes):  35   Critical care time was exclusive of:  Separately billable procedures and treating other patients   Critical care was necessary to treat or prevent imminent or life-threatening deterioration of the following conditions: angioedema.   Critical care was time spent personally by me on the following activities:  Development of treatment plan with patient or surrogate, discussions with consultants, evaluation of patient's response to treatment, examination of patient, ordering and review of laboratory studies, ordering and review of radiographic studies, ordering and performing treatments and interventions, pulse oximetry, re-evaluation of patient's condition and review of old charts    Medications Ordered in ED Medications  methylPREDNISolone sodium succinate (SOLU-MEDROL) 125 mg/2 mL injection 125 mg (has no administration in time range)  famotidine (PEPCID) IVPB 20 mg premix (has no administration in time range)  diphenhydrAMINE (BENADRYL) injection 25 mg (has no administration in time range)  tranexamic acid (CYKLOKAPRON) IVPB 1,000 mg (has no administration in time range)    ED Course/ Medical Decision Making/ A&P                           Medical Decision Making Amount and/or Complexity of Data Reviewed Labs: ordered. ECG/medicine tests: ordered.  Risk Prescription drug management.   Swelling consistent with angioedema, history of similar episodes in the past.  No wheezing.  Controlling her secretions.  No posterior pharyngeal swelling.  She gave herself epinephrine at home.  Will give steroids, antihistamines, TXA.  Observed in the ED for several hours with no deterioration.  Tongue swelling has improved and almost resolved.  She feels back to baseline.  No difficulty breathing or difficulty swallowing.  No chest pain or shortness of breath. Appears stable for discharge from angioedema standpoint. Uncertain etiology.  EKG without  acute ischemia.  Troponin has increased from 11 to 18, but she continues to deny any chest pain or shortness of breath currently Creatinine slightly worse to  2.2 from 1.6.  Will hydrate gently.  Patient remains asymptomatic.  No further tongue swelling and feels her tongue is back to baseline.  No chest pain, shortness of breath or palpitations. Troponin continues to rise slightly from 10-03-24-29. No acute ischemic EKG changes. Slow rise in troponin not consistent with ACS.  Suspect likely demand ischemia in setting of epinephrine use at home. D/w Dr. Johney Frame of cardiology who agrees.  Patient has seen cardiology at [facility] in the past for her atrial fibrillation but has not had any ischemic testing as far as can be told. Dr. Johney Frame will arrange for outpatient echocardiogram as well as stress test. She does not feel that patient needs to be admitted in absence of chest pain.  Patient requesting discharge home and does not want any further observation. Discussed follow-up with PCP and cardiology.  Unclear etiology of her allergic reaction today.  Give course of steroids and antihistamines.  Epinephrine pen will be refilled with instructions for use.  Return to the ED with chest pain, shortness of breath, tongue or lip swelling or any other concerns.        Final Clinical Impression(s) / ED Diagnoses Final diagnoses:  Angioedema, initial encounter  Elevated troponin    Rx / DC Orders ED Discharge Orders     None         Darleen Moffitt, Annie Main, MD 12/02/21 [phone]

## 2021-12-03 ENCOUNTER — Ambulatory Visit: Payer: Medicare HMO | Admitting: Interventional Cardiology

## 2021-12-03 NOTE — Progress Notes (Deleted)
Cardiology Office Note   Date:  12/03/2021   ID:  Angie Barrett, DOB June 18, 1941, MRN [id number]  PCP:  Lilian Coma., MD    No chief complaint on file.    Wt Readings from Last 3 Encounters:  12/01/21 163 lb (73.9 kg)  04/21/21 163 lb (73.9 kg)  10/19/20 163 lb (73.9 kg)       History of Present Illness: Angie Barrett is a 81 y.o. female  ***    Past Medical History:  Diagnosis Date   A-fib ([place])    Angioedema    Bradycardia    CKD (chronic kidney disease)    Diabetes mellitus without complication (HCC)    High cholesterol    Hypertension    Pain in both feet    Pain in both lower legs    Rhinorrhea    Vitreous floaters     Past Surgical History:  Procedure Laterality Date   LEG SURGERY       Current Outpatient Medications  Medication Sig Dispense Refill   acetaminophen (TYLENOL) 500 MG tablet Take 2 tablets (1,000 mg total) by mouth every 6 (six) hours as needed. 30 tablet 0   calcium carbonate (OSCAL) 1500 (600 Ca) MG TABS tablet Take 600 mg of elemental calcium by mouth 2 (two) times daily with a meal.     cholecalciferol (VITAMIN D3) 25 MCG (1000 UNIT) tablet Take 2,000 Units by mouth daily.     conjugated estrogens (PREMARIN) vaginal cream Place 1 Applicatorful vaginally 2 (two) times a week.     Continuous Blood Gluc Sensor (DEXCOM G6 SENSOR) MISC See admin instructions.     Continuous Blood Gluc Transmit (DEXCOM G6 TRANSMITTER) MISC See admin instructions.     ELIQUIS 2.5 MG TABS tablet Take 2.5 mg by mouth 2 (two) times daily.     EPINEPHrine 0.3 mg/0.3 mL IJ SOAJ injection Inject 0.3 mg into the muscle as needed for anaphylaxis. 2 each 1   EPINEPHrine 0.3 mg/0.3 mL IJ SOAJ injection Inject 0.3 mg into the muscle as needed for anaphylaxis. 1 each 0   fluticasone (FLONASE) 50 MCG/ACT nasal spray Place 2 sprays into both nostrils daily as needed for allergies or rhinitis.      ipratropium (ATROVENT) 0.06 % nasal spray Place 2 sprays into both  nostrils 4 (four) times daily. As needed for nasal congestion, runny nose 15 mL 0   linagliptin (TRADJENTA) 5 MG TABS tablet Take 5 mg by mouth daily.     metoprolol succinate (TOPROL-XL) 25 MG 24 hr tablet Take 25 mg by mouth daily.     NIFEdipine (PROCARDIA XL/NIFEDICAL-XL) 90 MG 24 hr tablet Take 90 mg by mouth daily.     omeprazole (PRILOSEC) 40 MG capsule Take 40 mg by mouth daily.     pioglitazone (ACTOS) 15 MG tablet Take 15 mg by mouth daily.     PRAVASTATIN SODIUM PO Take 80 mg by mouth daily.      predniSONE (DELTASONE) 50 MG tablet 1 tablet PO daily 5 tablet 0   Teriparatide, Recombinant, 620 MCG/2.48ML SOPN Inject 20 mcg into the skin daily. 2.24 mL 0   torsemide (DEMADEX) 20 MG tablet Take 20 mg by mouth daily.     No current facility-administered medications for this visit.    Allergies:   Amoxicillin-pot clavulanate, Ciprofloxacin, Clotrimazole, Nystatin, Olmesartan, Hydrochlorothiazide, Metformin, Spironolactone, Telithromycin, and Ace inhibitors    Social History:  The patient  reports that she has never smoked. She has never  used smokeless tobacco. She reports that she does not currently use alcohol. She reports that she does not use drugs.   Family History:  The patient's ***family history is not on file.    ROS:  Please see the history of present illness.   Otherwise, review of systems are positive for ***.   All other systems are reviewed and negative.    PHYSICAL EXAM: VS:  There were no vitals taken for this visit. , BMI There is no height or weight on file to calculate BMI. GEN: Well nourished, well developed, in no acute distress HEENT: normal Neck: no JVD, carotid bruits, or masses Cardiac: ***RRR; no murmurs, rubs, or gallops,no edema  Respiratory:  clear to auscultation bilaterally, normal work of breathing GI: soft, nontender, nondistended, + BS MS: no deformity or atrophy Skin: warm and dry, no rash Neuro:  Strength and sensation are intact Psych:  euthymic mood, full affect   EKG:   The ekg ordered today demonstrates ***   Recent Labs: 04/21/2021: ALT 22 12/02/2021: BUN 37; Creatinine, Ser 1.92; Hemoglobin 11.9; Platelets 275; Potassium 3.3; Sodium 138   Lipid Panel No results found for: CHOL, TRIG, HDL, CHOLHDL, VLDL, LDLCALC, LDLDIRECT   Other studies Reviewed: Additional studies/ records that were reviewed today with results demonstrating: ***.   ASSESSMENT AND PLAN:  ***  *** ***   Current medicines are reviewed at length with the patient today.  The patient concerns regarding her medicines were addressed.  The following changes have been made:  No change***  Labs/ tests ordered today include: *** No orders of the defined types were placed in this encounter.   Recommend 150 minutes/week of aerobic exercise Low fat, low carb, high fiber diet recommended  Disposition:   FU in ***   Signed, Larae Grooms, MD  12/03/2021 8:49 AM    [facility] [place], [place], [place]  [address] Phone: [phone]; Fax: [phone]

## 2021-12-04 NOTE — Progress Notes (Signed)
NEW PATIENT Date of Service/Encounter:  12/05/21 Referring provider: Lilian Coma., MD Primary care provider: Lilian Coma., MD  Subjective:  Angie Barrett is a 81 y.o. female with a PMHx of atrial fibrillation presenting today for evaluation of angioedema. History obtained from: chart review and patient.   Angioedema:  Last year, at night, woke up with lip swelling without rash on at least 3 occasions.  Lasted less than a day because she was treated in the ED. She thought it was dust mites so changed her bed.  These 3 episodes happen once every 2 to 3 months.  After changing her mattress in bed, ceased until her most recent episode 5 days ago.  5 days ago, ate chicken for dinner (possibly orange chicken).  Around 10 pm woke up from her sleep and her lip and tongue were swollen.  She gave herself epinephrine.   She doesn't think she had any mammalian meat that night for dinner.  Angioedema is always her only symptom.  She denies rash or any other systemic symptoms including respiratory, gastrointestinal, and or cardiovascular.  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  She ate them as a young child, but was told her whole life to avoid oranges due to previous reaction.  She doesn't remember ever eating oranges or what her previous reaction entailed.  She is not taking an ACE-I or ARB.  She doesn't recall ever being prescribed medications from this class.  Eliquis is new - started about 1.5 months ago.  She was diagnosed with a sinus infection on Tuesday.  Otherwise no significant changes to her lifestyle.  She has had allergy testing many years ago and was positive to dust mites.   Chart review: ED visit on 12/01/2021-history of recurrent angioedema, awoke from sleep with right-sided tongue swelling.  Had chicken for dinner.  Self treated with epinephrine in the past.  Not taking ACE inhibitor or ARB. Labs 12/02/2021: CBC differential with AEC 200, mild stable anemia and otherwise  reassuring BMP with elevated BUN and creatinine  Past Medical History: Past Medical History:  Diagnosis Date   A-fib (HCC)    Angioedema    Bradycardia    CKD (chronic kidney disease)    Diabetes mellitus without complication (HCC)    High cholesterol    Hypertension    Pain in both feet    Pain in both lower legs    Rhinorrhea    Vitreous floaters    Medication List:  Current Outpatient Medications  Medication Sig Dispense Refill   acetaminophen (TYLENOL) 500 MG tablet Take 2 tablets (1,000 mg total) by mouth every 6 (six) hours as needed. 30 tablet 0   aspirin EC 81 MG tablet Take 81 mg by mouth daily. Swallow whole.     calcium carbonate (OSCAL) 1500 (600 Ca) MG TABS tablet Take 600 mg of elemental calcium by mouth 2 (two) times daily with a meal.     cholecalciferol (VITAMIN D3) 25 MCG (1000 UNIT) tablet Take 2,000 Units by mouth daily.     conjugated estrogens (PREMARIN) vaginal cream Place 1 Applicatorful vaginally 2 (two) times a week.     Continuous Blood Gluc Sensor (DEXCOM G6 SENSOR) MISC See admin instructions.     Continuous Blood Gluc Transmit (DEXCOM G6 TRANSMITTER) MISC See admin instructions.     ELIQUIS 2.5 MG TABS tablet Take 2.5 mg by mouth 2 (two) times daily.     EPINEPHrine 0.3 mg/0.3 mL IJ SOAJ injection Inject 0.3 mg  into the muscle as needed for anaphylaxis. 2 each 1   fluticasone (FLONASE) 50 MCG/ACT nasal spray Place 2 sprays into both nostrils daily as needed for allergies or rhinitis.      ipratropium (ATROVENT) 0.06 % nasal spray Place 2 sprays into both nostrils 4 (four) times daily. As needed for nasal congestion, runny nose 15 mL 0   linagliptin (TRADJENTA) 5 MG TABS tablet Take 5 mg by mouth daily.     metoprolol succinate (TOPROL-XL) 25 MG 24 hr tablet Take 25 mg by mouth daily.     NIFEdipine (PROCARDIA XL/NIFEDICAL-XL) 90 MG 24 hr tablet Take 90 mg by mouth daily.     omeprazole (PRILOSEC) 40 MG capsule Take 40 mg by mouth daily.      pioglitazone (ACTOS) 15 MG tablet Take 15 mg by mouth daily.     rosuvastatin (CRESTOR) 40 MG tablet Take 40 mg by mouth daily.     No current facility-administered medications for this visit.   Known Allergies:  Allergies  Allergen Reactions   Amoxicillin-Pot Clavulanate Swelling   Ciprofloxacin Swelling   Clotrimazole Swelling    Could not breathe. Could not breathe.    Nystatin Itching   Olmesartan Other (See Comments) and Swelling   Hydrochlorothiazide Other (See Comments)    hyponatremia    Metformin Other (See Comments)    Contraindication due to Renal Insufficiency Other reaction(s): CONTRAINDICATED DUE TO RENAL INSUFF    Spironolactone Other (See Comments)    hyponatremia   Telithromycin Other (See Comments)    Unknown Reaction per Patient    Ace Inhibitors    Past Surgical History: Past Surgical History:  Procedure Laterality Date   LEG SURGERY     Family History: History reviewed. No pertinent family history. Social History: Angie Barrett lives in a house built 60 years ago, no water damage, carpet floors, electric heating, central AC, no pets, no cockroaches, using dust mite protection on bedding, no smoke exposure.  She is retired.  No hobbies where she is exposed to fumes, chemicals, or dust.  No HEPA filter in the home.  [place]  ROS:  All other systems negative except as noted per HPI.  Objective:  Blood pressure 102/62, pulse 68, temperature (!) 97.3 F (36.3 C), temperature source Temporal, resp. rate 18, height 5\' 4"  (1.626 m), weight 156 lb 6.4 oz (70.9 kg), SpO2 100 %. Body mass index is 26.85 kg/m. Physical Exam:  General Appearance:  Alert, cooperative, no distress, appears stated age  Head:  Normocephalic, without obvious abnormality, atraumatic  Eyes:  Conjunctiva clear, EOM's intact  Nose: Nares normal, normal mucosa  Throat: Lips, tongue normal; teeth and gums normal, normal posterior oropharynx  Neck: Supple,  symmetrical  Lungs:   clear to auscultation bilaterally, Respirations unlabored, no coughing  Heart:  regular rate and rhythm and no murmur, Appears well perfused  Extremities: No edema  Skin: Skin color, texture, turgor normal, no rashes or lesions on visualized portions of skin  Neurologic: No gross deficits     Diagnostics: Skin Testing:  Deferred given recent reaction history and potential for false negative .  Assessment and Plan   Patient Instructions  Angioedema (tissue swelling):  - there are 2 common types of swelling  - bradykinin mediated (usually no itching or hives, lasts ~2-3 days, doesn't respond to antihistamines or epinephrine) Vs - histamine mediated (usually with hives or itching, lasts 1-2 days, responds to antihistamines or epinephrine) - you overlap with both categories,  so we will do testing to rule out some common causes of both - obtain today labs for histamine causes: tryptase, alpha gal panel (red meat allergy) - please avoid mammalian meats (pork, beef, venison, etc) until your alpha gal panel returns; also avoid oranges - obtain today labs for bradykinin-associated causes: C4, C1 esterase function/level, C1q, CBCd, immunoglobulin levels, T, B, NK cells enumeration, SPEP, basic food panel, citrus panel, environmental panel   For Now:  - consider starting zyrtec (cetirizine) 1 tablet daily to see if this will prevent episodes. - Epipen to be injected in your outer thigh if concern for airway involvement (throat closing, difficulty breathing, etc) - if you feel airway involvement, please call 911 and seek emergency medical care - we will contact you with results  Will call with results to discuss next steps.  It was a pleasure meeting you in clinic today!   Sigurd Sos, MD [facility]    This note in its entirety was forwarded to the Provider who requested this consultation.  Thank you for your kind referral. I appreciate the  opportunity to take part in Angie Barrett's care. Please do not hesitate to contact me with questions.  Sincerely,  Sigurd Sos, MD [facility]

## 2021-12-05 ENCOUNTER — Encounter: Payer: Self-pay | Admitting: Internal Medicine

## 2021-12-05 ENCOUNTER — Ambulatory Visit: Payer: Medicare HMO | Admitting: Internal Medicine

## 2021-12-05 ENCOUNTER — Other Ambulatory Visit: Payer: Self-pay

## 2021-12-05 VITALS — BP 102/62 | HR 68 | Temp 97.3°F | Resp 18 | Ht 64.0 in | Wt 156.4 lb

## 2021-12-05 DIAGNOSIS — T783XXA Angioneurotic edema, initial encounter: Secondary | ICD-10-CM | POA: Diagnosis not present

## 2021-12-05 NOTE — Patient Instructions (Signed)
Angioedema (tissue swelling):  - there are 2 common types of swelling  - bradykinin mediated (usually no itching or hives, lasts ~2-3 days, doesn't respond to antihistamines or epinephrine) Vs - histamine mediated (usually with hives or itching, lasts 1-2 days, responds to antihistamines or epinephrine) - you overlap with both categories, so we will do testing to rule out some common causes of both - obtain today labs for histamine causes: tryptase, alpha gal panel (red meat allergy) - please avoid mammalian meats (pork, beef, venison, etc) until your alpha gal panel returns; also avoid oranges - obtain today labs for bradykinin-associated causes: C4, C1 esterase function/level, C1q, CBCd, immunoglobulin levels, T, B, NK cells enumeration, SPEP, basic food panel, citrus panel, environmental panel   For Now:  - consider starting zyrtec (cetirizine) 1 tablet daily to see if this will prevent episodes. - Epipen to be injected in your outer thigh if concern for airway involvement (throat closing, difficulty breathing, etc) - if you feel airway involvement, please call 911 and seek emergency medical care - we will contact you with results  Will call with results to discuss next steps.  It was a pleasure meeting you in clinic today!   Sigurd Sos, MD [facility]

## 2021-12-14 ENCOUNTER — Other Ambulatory Visit: Payer: Self-pay

## 2021-12-14 ENCOUNTER — Emergency Department (HOSPITAL_BASED_OUTPATIENT_CLINIC_OR_DEPARTMENT_OTHER)
Admission: EM | Admit: 2021-12-14 | Discharge: 2021-12-14 | Disposition: A | Discharge status: Home / Self Care | Payer: Medicare HMO | Attending: Emergency Medicine | Admitting: Emergency Medicine

## 2021-12-14 ENCOUNTER — Emergency Department (HOSPITAL_BASED_OUTPATIENT_CLINIC_OR_DEPARTMENT_OTHER): Payer: Medicare HMO

## 2021-12-14 ENCOUNTER — Encounter (HOSPITAL_BASED_OUTPATIENT_CLINIC_OR_DEPARTMENT_OTHER): Payer: Self-pay | Admitting: Emergency Medicine

## 2021-12-14 DIAGNOSIS — N184 Chronic kidney disease, stage 4 (severe): Secondary | ICD-10-CM

## 2021-12-14 DIAGNOSIS — Z7901 Long term (current) use of anticoagulants: Secondary | ICD-10-CM | POA: Diagnosis not present

## 2021-12-14 DIAGNOSIS — U071 COVID-19: Secondary | ICD-10-CM | POA: Insufficient documentation

## 2021-12-14 DIAGNOSIS — R002 Palpitations: Secondary | ICD-10-CM

## 2021-12-14 DIAGNOSIS — Z79899 Other long term (current) drug therapy: Secondary | ICD-10-CM | POA: Insufficient documentation

## 2021-12-14 DIAGNOSIS — Z7982 Long term (current) use of aspirin: Secondary | ICD-10-CM | POA: Insufficient documentation

## 2021-12-14 DIAGNOSIS — I10 Essential (primary) hypertension: Secondary | ICD-10-CM

## 2021-12-14 DIAGNOSIS — Z8679 Personal history of other diseases of the circulatory system: Secondary | ICD-10-CM

## 2021-12-14 DIAGNOSIS — R059 Cough, unspecified: Secondary | ICD-10-CM | POA: Diagnosis present

## 2021-12-14 DIAGNOSIS — E876 Hypokalemia: Secondary | ICD-10-CM

## 2021-12-14 LAB — CBC
HCT: 34.3 % — ABNORMAL LOW (ref 36.0–46.0)
Hemoglobin: 11.8 g/dL — ABNORMAL LOW (ref 12.0–15.0)
MCH: 31.6 pg (ref 26.0–34.0)
MCHC: 34.4 g/dL (ref 30.0–36.0)
MCV: 92 fL (ref 80.0–100.0)
Platelets: 213 10*3/uL (ref 150–400)
RBC: 3.73 MIL/uL — ABNORMAL LOW (ref 3.87–5.11)
RDW: 14.4 % (ref 11.5–15.5)
WBC: 8 10*3/uL (ref 4.0–10.5)
nRBC: 0 % (ref 0.0–0.2)

## 2021-12-14 LAB — BASIC METABOLIC PANEL
Anion gap: 11 (ref 5–15)
BUN: 25 mg/dL — ABNORMAL HIGH (ref 8–23)
CO2: 25 mmol/L (ref 22–32)
Calcium: 9.6 mg/dL (ref 8.9–10.3)
Chloride: 99 mmol/L (ref 98–111)
Creatinine, Ser: 1.88 mg/dL — ABNORMAL HIGH (ref 0.44–1.00)
GFR, Estimated: 27 mL/min — ABNORMAL LOW (ref >60)
Glucose, Bld: 172 mg/dL — ABNORMAL HIGH (ref 70–99)
Potassium: 3.3 mmol/L — ABNORMAL LOW (ref 3.5–5.1)
Sodium: 135 mmol/L (ref 135–145)

## 2021-12-14 LAB — TROPONIN I (HIGH SENSITIVITY): Troponin I (High Sensitivity): 11 ng/L (ref <18)

## 2021-12-14 LAB — RESP PANEL BY RT-PCR (FLU A&B, COVID) ARPGX2
Influenza A by PCR: NEGATIVE
Influenza B by PCR: NEGATIVE
SARS Coronavirus 2 by RT PCR: POSITIVE — AB

## 2021-12-14 MED ORDER — POTASSIUM CHLORIDE CRYS ER 20 MEQ PO TBCR
20.0000 meq | EXTENDED_RELEASE_TABLET | Freq: Once | ORAL | Status: AC
  Administered 2021-12-14: 20 meq via ORAL
  Filled 2021-12-14: qty 1

## 2021-12-14 MED ORDER — MOLNUPIRAVIR EUA 200MG CAPSULE: 4.0000 | ORAL_CAPSULE | Freq: Two times a day (BID) | ORAL | 0 refills | Status: AC

## 2021-12-14 MED ORDER — MOLNUPIRAVIR EUA 200MG CAPSULE: 4.0000 | ORAL_CAPSULE | Freq: Two times a day (BID) | ORAL | 0 refills | Status: DC

## 2021-12-14 NOTE — Discharge Instructions (Signed)
It was our pleasure to provide your ER care today - we hope that you feel better.  Your covid test is positive - see attached info. Take molnupiravir as prescribed.   From today's labs, your potassium level is mildly low - eat plenty of fruits and vegetables, and follow up with your doctor in one week.   Return to ER if worse, new symptoms, persistent fast heart beat, chest pain, increased trouble breathing, or other concern.

## 2021-12-14 NOTE — ED Triage Notes (Signed)
Pt arrives pov with c/o tachycardia and CP with shob x 1 hr. Pt recent dx with afib. HR 90 in triage.

## 2021-12-14 NOTE — ED Provider Notes (Signed)
[facility] EMERGENCY DEPARTMENT Provider Note   CSN: [id number] Arrival date & time: 12/14/21  1650     History  Chief Complaint  Patient presents with   Chest Pain    Angie Barrett is a 81 y.o. female.  Patient c/o sense of rapid heart beating. Hx afib. Symptoms acute onset today, moderate, persistent. Denies chest pain or sob. No syncope. States compliant w home meds.  Also w recent non prod cough, nasal congestion. No fever. No known covid or flu exposure. Denies leg pain or swelling. No unusual doe or fatigue.   The history is provided by the patient and medical records.  Chest Pain Associated symptoms: cough and palpitations   Associated symptoms: no abdominal pain, no back pain, no fever, no headache, no shortness of breath and no vomiting       Home Medications Prior to Admission medications   Medication Sig Start Date End Date Taking? Authorizing Provider  acetaminophen (TYLENOL) 500 MG tablet Take 2 tablets (1,000 mg total) by mouth every 6 (six) hours as needed. 05/22/18   Charlesetta Shanks, MD  aspirin EC 81 MG tablet Take 81 mg by mouth daily. Swallow whole.    [provider]  calcium carbonate (OSCAL) 1500 (600 Ca) MG TABS tablet Take 600 mg of elemental calcium by mouth 2 (two) times daily with a meal.    [provider]  cholecalciferol (VITAMIN D3) 25 MCG (1000 UNIT) tablet Take 2,000 Units by mouth daily.    [provider]  conjugated estrogens (PREMARIN) vaginal cream Place 1 Applicatorful vaginally 2 (two) times a week.    [provider]  Continuous Blood Gluc Sensor (DEXCOM G6 SENSOR) MISC See admin instructions. 02/20/21   [provider]  Continuous Blood Gluc Transmit (DEXCOM G6 TRANSMITTER) MISC See admin instructions. 02/20/21   [provider]  ELIQUIS 2.5 MG TABS tablet Take 2.5 mg by mouth 2 (two) times daily. 11/03/21   [provider]  EPINEPHrine 0.3 mg/0.3 mL IJ SOAJ injection  Inject 0.3 mg into the muscle as needed for anaphylaxis. 08/27/20   Alexandria Lodge, MD  fluticasone (FLONASE) 50 MCG/ACT nasal spray Place 2 sprays into both nostrils daily as needed for allergies or rhinitis.     [provider]  ipratropium (ATROVENT) 0.06 % nasal spray Place 2 sprays into both nostrils 4 (four) times daily. As needed for nasal congestion, runny nose 11/17/21   Lynden Oxford Scales, PA-C  linagliptin (TRADJENTA) 5 MG TABS tablet Take 5 mg by mouth daily.    [provider]  metoprolol succinate (TOPROL-XL) 25 MG 24 hr tablet Take 25 mg by mouth daily.    [provider]  NIFEdipine (PROCARDIA XL/NIFEDICAL-XL) 90 MG 24 hr tablet Take 90 mg by mouth daily.    [provider]  omeprazole (PRILOSEC) 40 MG capsule Take 40 mg by mouth daily.    [provider]  pioglitazone (ACTOS) 15 MG tablet Take 15 mg by mouth daily.    [provider]  rosuvastatin (CRESTOR) 40 MG tablet Take 40 mg by mouth daily.    [provider]      Allergies    Amoxicillin-pot clavulanate, Ciprofloxacin, Clotrimazole, Nystatin, Olmesartan, Hydrochlorothiazide, Metformin, Spironolactone, Telithromycin, and Ace inhibitors    Review of Systems   Review of Systems  Constitutional:  Negative for fever.  HENT:  Positive for congestion. Negative for sore throat.   Eyes:  Negative for redness.  Respiratory:  Positive for cough. Negative  for shortness of breath.   Cardiovascular:  Positive for chest pain and palpitations. Negative for leg swelling.  Gastrointestinal:  Negative for abdominal pain, diarrhea and vomiting.  Genitourinary:  Negative for dysuria and flank pain.  Musculoskeletal:  Negative for back pain and neck pain.  Skin:  Negative for rash.  Neurological:  Negative for headaches.  Hematological:        Denies recent bruising or bleeding.   Psychiatric/Behavioral:  Negative for confusion.    Physical Exam Updated Vital Signs BP  (!) 178/60    Pulse 90    Temp 98.5 F (36.9 C) (Oral)    Resp 18    Ht 1.626 m (5\' 4" )    Wt 72.6 kg    SpO2 100%    BMI 27.46 kg/m  Physical Exam Vitals and nursing note reviewed.  Constitutional:      Appearance: Normal appearance. She is well-developed.  HENT:     Head: Atraumatic.     Nose: Nose normal.     Mouth/Throat:     Mouth: Mucous membranes are moist.  Eyes:     General: No scleral icterus.    Conjunctiva/sclera: Conjunctivae normal.  Neck:     Trachea: No tracheal deviation.  Cardiovascular:     Rate and Rhythm: Normal rate and regular rhythm.     Pulses: Normal pulses.     Heart sounds: Normal heart sounds. No murmur heard.   No friction rub. No gallop.  Pulmonary:     Effort: Pulmonary effort is normal. No respiratory distress.     Breath sounds: Normal breath sounds.  Abdominal:     General: Bowel sounds are normal. There is no distension.     Palpations: Abdomen is soft.     Tenderness: There is no abdominal tenderness. There is no guarding.  Genitourinary:    Comments: No cva tenderness.  Musculoskeletal:        General: No swelling or tenderness.     Cervical back: Normal range of motion and neck supple. No rigidity. No muscular tenderness.     Right lower leg: No edema.     Left lower leg: No edema.  Skin:    General: Skin is warm and dry.     Findings: No rash.  Neurological:     Mental Status: She is alert.     Comments: Alert, speech normal.   Psychiatric:        Mood and Affect: Mood normal.    ED Results / Procedures / Treatments   Labs (all labs ordered are listed, but only abnormal results are displayed) Results for orders placed or performed during the hospital encounter of 12/14/21  Resp Panel by RT-PCR (Flu A&B, Covid) Nasopharyngeal Swab   Specimen: Nasopharyngeal Swab; Nasopharyngeal(NP) swabs in vial transport medium  Result Value Ref Range   SARS Coronavirus 2 by RT PCR POSITIVE (A) NEGATIVE   Influenza A by PCR NEGATIVE  NEGATIVE   Influenza B by PCR NEGATIVE NEGATIVE  Basic metabolic panel  Result Value Ref Range   Sodium 135 135 - 145 mmol/L   Potassium 3.3 (L) 3.5 - 5.1 mmol/L   Chloride 99 98 - 111 mmol/L   CO2 25 22 - 32 mmol/L   Glucose, Bld 172 (H) 70 - 99 mg/dL   BUN 25 (H) 8 - 23 mg/dL   Creatinine, Ser 1.88 (H) 0.44 - 1.00 mg/dL   Calcium 9.6 8.9 - 10.3 mg/dL   GFR, Estimated 27 (L) >60 mL/min  Anion gap 11 5 - 15  CBC  Result Value Ref Range   WBC 8.0 4.0 - 10.5 K/uL   RBC 3.73 (L) 3.87 - 5.11 MIL/uL   Hemoglobin 11.8 (L) 12.0 - 15.0 g/dL   HCT 34.3 (L) 36.0 - 46.0 %   MCV 92.0 80.0 - 100.0 fL   MCH 31.6 26.0 - 34.0 pg   MCHC 34.4 30.0 - 36.0 g/dL   RDW 14.4 11.5 - 15.5 %   Platelets 213 150 - 400 K/uL   nRBC 0.0 0.0 - 0.2 %  Troponin I (High Sensitivity)  Result Value Ref Range   Troponin I (High Sensitivity) 11 <18 ng/L    EKG EKG Interpretation  Date/Time:  Sunday December 14 2021 16:58:28 EST Ventricular Rate:  87 PR Interval:  166 QRS Duration: 96 QT Interval:  392 QTC Calculation: 471 R Axis:   -19 Text Interpretation: Normal sinus rhythm Nonspecific ST abnormality Confirmed by Lajean Saver [phone]) on 12/14/2021 5:08:01 PM  Radiology DG Chest 2 View  Result Date: 12/14/2021 CLINICAL DATA:  Chest pain. EXAM: CHEST - 2 VIEW COMPARISON:  Chest x-ray 08/25/2020. FINDINGS: The heart size and mediastinal contours are within normal limits. Both lungs are clear. The visualized skeletal structures are unremarkable. IMPRESSION: No active cardiopulmonary disease. Electronically Signed   By: Ronney Asters M.D.   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 Labs: ordered. Decision-making details documented in ED Course. Radiology: ordered and independent interpretation performed. Decision-making details documented in ED Course. ECG/medicine tests: ordered and independent interpretation performed. Decision-making details documented in ED Course.  Risk OTC drugs. Prescription drug management. Decision regarding hospitalization.   Iv ns. Continuous pulse ox and acardiac monitoring. Stat labs and imaging. Disposition considered admission - pending labs and imaging results.   Reviewed nursing notes and prior charts for additional history. External reports reviewed.   Labs reviewed/interpreted by me - ckd, similar to baseline. K mildly low - kcl po. Covid test positive - discussed w pt - molnupiravir rx provided.   CXR reviewed/interpreted by me - no pna.   Pt notes 'palpitations' during evaluation, and on monitor, at same time, is in nsr. No chest pain.   Recheck pt, no chest pain or sob. No increased wob. Hr 72, pulse ox 100%.   Pt currently appears stable for d/c.   Return precautions provided.         Final Clinical Impression(s) / ED Diagnoses Final diagnoses:  None    Rx / DC Orders ED Discharge Orders     None         Lajean Saver, MD 12/14/21 [phone]

## 2021-12-16 LAB — ALLERGENS, ZONE 2
Alternaria Alternata IgE: <0.1 kU/L
Amer Sycamore IgE Qn: <0.1 kU/L
Aspergillus Fumigatus IgE: <0.1 kU/L
Bahia Grass IgE: <0.1 kU/L
Bermuda Grass IgE: <0.1 kU/L
Cat Dander IgE: <0.1 kU/L
Cedar, Mountain IgE: <0.1 kU/L
Cladosporium Herbarum IgE: <0.1 kU/L
Cockroach, American IgE: <0.1 kU/L
Common Silver Birch IgE: <0.1 kU/L
D Farinae IgE: 4.25 kU/L — AB
D Pteronyssinus IgE: 2.13 kU/L — AB
Dog Dander IgE: <0.1 kU/L
Elm, American IgE: <0.1 kU/L
Hickory, White IgE: 0.1 kU/L — AB
Johnson Grass IgE: <0.1 kU/L
Maple/Box Elder IgE: <0.1 kU/L
Mucor Racemosus IgE: <0.1 kU/L
Mugwort IgE Qn: <0.1 kU/L
Nettle IgE: 0.13 kU/L — AB
Oak, White IgE: <0.1 kU/L
Penicillium Chrysogen IgE: <0.1 kU/L
Pigweed, Rough IgE: <0.1 kU/L
Plantain, English IgE: <0.1 kU/L
Ragweed, Short IgE: <0.1 kU/L
Sheep Sorrel IgE Qn: <0.1 kU/L
Stemphylium Herbarum IgE: <0.1 kU/L
Sweet gum IgE RAST Ql: <0.1 kU/L
Timothy Grass IgE: <0.1 kU/L
White Mulberry IgE: <0.1 kU/L

## 2021-12-16 LAB — LYMPH ENUMERATION, BASIC & NK CELLS
% CD 3 Pos. Lymph.: 69 % (ref 57.5–86.2)
% CD 4 Pos. Lymph.: 50 % (ref 30.8–58.5)
% NK (CD56/16): 16.9 % (ref 1.4–19.4)
Ab NK (CD56/16): 355 /uL (ref 24–406)
Absolute CD 3: 1449 /uL (ref 622–2402)
Absolute CD 4 Helper: 1050 /uL (ref 359–1519)
Basophils Absolute: 0 10*3/uL (ref 0.0–0.2)
Basos: 0 %
CD19 % B Cell: 13.3 % (ref 3.3–25.4)
CD19 Abs: 279 /uL (ref 12–645)
CD4/CD8 Ratio: 2.34 (ref 0.92–3.72)
CD8 % Suppressor T Cell: 21.4 % (ref 12.0–35.5)
CD8 T Cell Abs: 449 /uL (ref 109–897)
EOS (ABSOLUTE): 0.1 10*3/uL (ref 0.0–0.4)
Eos: 1 %
Hematocrit: 35.4 % (ref 34.0–46.6)
Hemoglobin: 12 g/dL (ref 11.1–15.9)
Immature Grans (Abs): 0 10*3/uL (ref 0.0–0.1)
Immature Granulocytes: 0 %
Lymphocytes Absolute: 2.1 10*3/uL (ref 0.7–3.1)
Lymphs: 35 %
MCH: 31.5 pg (ref 26.6–33.0)
MCHC: 33.9 g/dL (ref 31.5–35.7)
MCV: 93 fL (ref 79–97)
Monocytes Absolute: 0.5 10*3/uL (ref 0.1–0.9)
Monocytes: 9 %
Neutrophils Absolute: 3.2 10*3/uL (ref 1.4–7.0)
Neutrophils: 55 %
Platelets: 254 10*3/uL (ref 150–450)
RBC: 3.81 x10E6/uL (ref 3.77–5.28)
RDW: 13.1 % (ref 11.7–15.4)
WBC: 5.9 10*3/uL (ref 3.4–10.8)

## 2021-12-16 LAB — ALLERGEN PROFILE, BASIC FOOD
Allergen Corn, IgE: <0.1 kU/L
Chocolate/Cacao IgE: <0.1 kU/L
Egg, Whole IgE: <0.1 kU/L
Food Mix (Seafoods) IgE: NEGATIVE
Milk IgE: <0.1 kU/L
Peanut IgE: <0.1 kU/L
Soybean IgE: <0.1 kU/L
Wheat IgE: <0.1 kU/L

## 2021-12-16 LAB — ALPHA-GAL PANEL
Allergen Lamb IgE: <0.1 kU/L
Beef IgE: <0.1 kU/L
IgE (Immunoglobulin E), Serum: 161 IU/mL (ref 6–495)
O215-IgE Alpha-Gal: <0.1 kU/L
Pork IgE: <0.1 kU/L

## 2021-12-16 LAB — PROTEIN ELECTROPHORESIS, SERUM
A/G Ratio: 1 (ref 0.7–1.7)
Albumin ELP: 3.1 g/dL (ref 2.9–4.4)
Alpha 1: 0.2 g/dL (ref 0.0–0.4)
Alpha 2: 0.9 g/dL (ref 0.4–1.0)
Beta: 0.9 g/dL (ref 0.7–1.3)
Gamma Globulin: 1.1 g/dL (ref 0.4–1.8)
Globulin, Total: 3.2 g/dL (ref 2.2–3.9)
Total Protein: 6.3 g/dL (ref 6.0–8.5)

## 2021-12-16 LAB — ALLERGEN PROFILE, FOOD-CITRUS
Allergen Grapefruit IgE: <0.1 kU/L
Allergen Lime IgE: <0.1 kU/L
Lemon: <0.1 kU/L
Orange: <0.1 kU/L
Tangerine IgE: <0.1 kU/L

## 2021-12-16 LAB — C1 ESTERASE INHIBITOR: C1INH SerPl-mCnc: 34 mg/dL (ref 21–39)

## 2021-12-16 LAB — C3 AND C4
Complement C3, Serum: 119 mg/dL (ref 82–167)
Complement C4, Serum: 47 mg/dL — ABNORMAL HIGH (ref 12–38)

## 2021-12-16 LAB — IGG, IGA, IGM
IgA/Immunoglobulin A, Serum: 134 mg/dL (ref 64–422)
IgG (Immunoglobin G), Serum: 1241 mg/dL (ref 586–1602)
IgM (Immunoglobulin M), Srm: 106 mg/dL (ref 26–217)

## 2021-12-16 LAB — C1 ESTERASE INHIBITOR, FUNCTIONAL: C1INH Functional/C1INH Total MFr SerPl: >93 %mean normal

## 2021-12-16 LAB — TRYPTASE: Tryptase: 9.6 ug/L (ref 2.2–13.2)

## 2021-12-16 LAB — COMPLEMENT COMPONENT C1Q: Complement C1Q: 13.2 mg/dL (ref 10.3–20.5)

## 2021-12-17 NOTE — Progress Notes (Signed)
Please let Ms. Speights know that her labs have all returned. She does not seem to have the inherited or acquired form of swelling we talked about based on her lab results.    She is still allergic to dust mites, but everything else was negative on her environmental panel.    Regarding food allergy testing, the mammalian meat allergy we tested for was negative.  Her orange, citrus fruits, most common food allergen panel-were also all negative.   Once it has been at least 6 weeks from her reaction, we can do a food allergy skin test panel if she would like.  However, my suspicion for food allergy as cause of these episodes is low based on her history.  Happy to further explore this with her though.   In the meantime, I would have her start the zyrtec 10 mg daily if she hasn't already.  (If she decides to do skin testing, will need to discontinue for 3 days prior to appointment).   Let me know if she has any questions.

## 2021-12-20 ENCOUNTER — Encounter (HOSPITAL_BASED_OUTPATIENT_CLINIC_OR_DEPARTMENT_OTHER): Payer: Self-pay | Admitting: Emergency Medicine

## 2021-12-20 ENCOUNTER — Other Ambulatory Visit: Payer: Self-pay

## 2021-12-20 ENCOUNTER — Emergency Department (HOSPITAL_BASED_OUTPATIENT_CLINIC_OR_DEPARTMENT_OTHER)
Admission: EM | Admit: 2021-12-20 | Discharge: 2021-12-20 | Disposition: A | Discharge status: Home / Self Care | Payer: Medicare HMO | Attending: Emergency Medicine | Admitting: Emergency Medicine

## 2021-12-20 DIAGNOSIS — N189 Chronic kidney disease, unspecified: Secondary | ICD-10-CM | POA: Diagnosis not present

## 2021-12-20 DIAGNOSIS — Z7982 Long term (current) use of aspirin: Secondary | ICD-10-CM | POA: Diagnosis not present

## 2021-12-20 DIAGNOSIS — I129 Hypertensive chronic kidney disease with stage 1 through stage 4 chronic kidney disease, or unspecified chronic kidney disease: Secondary | ICD-10-CM | POA: Insufficient documentation

## 2021-12-20 DIAGNOSIS — R002 Palpitations: Secondary | ICD-10-CM | POA: Insufficient documentation

## 2021-12-20 DIAGNOSIS — E1122 Type 2 diabetes mellitus with diabetic chronic kidney disease: Secondary | ICD-10-CM | POA: Insufficient documentation

## 2021-12-20 DIAGNOSIS — U071 COVID-19: Secondary | ICD-10-CM | POA: Insufficient documentation

## 2021-12-20 DIAGNOSIS — Z79899 Other long term (current) drug therapy: Secondary | ICD-10-CM | POA: Diagnosis not present

## 2021-12-20 DIAGNOSIS — R42 Dizziness and giddiness: Secondary | ICD-10-CM | POA: Diagnosis not present

## 2021-12-20 LAB — BASIC METABOLIC PANEL
Anion gap: 10 (ref 5–15)
BUN: 23 mg/dL (ref 8–23)
CO2: 28 mmol/L (ref 22–32)
Calcium: 9.4 mg/dL (ref 8.9–10.3)
Chloride: 98 mmol/L (ref 98–111)
Creatinine, Ser: 1.8 mg/dL — ABNORMAL HIGH (ref 0.44–1.00)
GFR, Estimated: 28 mL/min — ABNORMAL LOW (ref >60)
Glucose, Bld: 144 mg/dL — ABNORMAL HIGH (ref 70–99)
Potassium: 3.1 mmol/L — ABNORMAL LOW (ref 3.5–5.1)
Sodium: 136 mmol/L (ref 135–145)

## 2021-12-20 LAB — CBC WITH DIFFERENTIAL/PLATELET
Abs Immature Granulocytes: 0.01 10*3/uL (ref 0.00–0.07)
Basophils Absolute: 0 10*3/uL (ref 0.0–0.1)
Basophils Relative: 0 %
Eosinophils Absolute: 0.1 10*3/uL (ref 0.0–0.5)
Eosinophils Relative: 1 %
HCT: 33 % — ABNORMAL LOW (ref 36.0–46.0)
Hemoglobin: 11.3 g/dL — ABNORMAL LOW (ref 12.0–15.0)
Immature Granulocytes: 0 %
Lymphocytes Relative: 31 %
Lymphs Abs: 1.5 10*3/uL (ref 0.7–4.0)
MCH: 31.3 pg (ref 26.0–34.0)
MCHC: 34.2 g/dL (ref 30.0–36.0)
MCV: 91.4 fL (ref 80.0–100.0)
Monocytes Absolute: 0.5 10*3/uL (ref 0.1–1.0)
Monocytes Relative: 10 %
Neutro Abs: 2.9 10*3/uL (ref 1.7–7.7)
Neutrophils Relative %: 58 %
Platelets: 214 10*3/uL (ref 150–400)
RBC: 3.61 MIL/uL — ABNORMAL LOW (ref 3.87–5.11)
RDW: 14.4 % (ref 11.5–15.5)
WBC: 4.9 10*3/uL (ref 4.0–10.5)
nRBC: 0 % (ref 0.0–0.2)

## 2021-12-20 LAB — URINALYSIS, ROUTINE W REFLEX MICROSCOPIC
Bilirubin Urine: NEGATIVE
Glucose, UA: NEGATIVE mg/dL
Ketones, ur: NEGATIVE mg/dL
Nitrite: NEGATIVE
Protein, ur: NEGATIVE mg/dL
Specific Gravity, Urine: <=1.005 (ref 1.005–1.030)
pH: 5.5 (ref 5.0–8.0)

## 2021-12-20 LAB — URINALYSIS, MICROSCOPIC (REFLEX)

## 2021-12-20 MED ORDER — POTASSIUM CHLORIDE CRYS ER 20 MEQ PO TBCR
40.0000 meq | EXTENDED_RELEASE_TABLET | Freq: Once | ORAL | Status: AC
  Administered 2021-12-20: 40 meq via ORAL
  Filled 2021-12-20: qty 2

## 2021-12-20 NOTE — Discharge Instructions (Signed)
Follow-up with your primary care doctor this week for recheck if your symptoms have not improved.  Return to the emergency room if you have any worsening symptoms.

## 2021-12-20 NOTE — ED Notes (Signed)
ED Provider at bedside. 

## 2021-12-20 NOTE — ED Provider Notes (Signed)
[facility] EMERGENCY DEPARTMENT Provider Note   CSN: [id number] Arrival date & time: 12/20/21  1326     History  Chief Complaint  Patient presents with   Palpitations    Angie Barrett is a 81 y.o. female.  Patient is a 81 year old female with a history of diabetes, hypertension, atrial fibrillation on Eliquis, chronic kidney disease who presents with palpitations and lightheadedness.    She was diagnosed with COVID last week and she thinks she actually had a longer than that because she had had some sinus congestion and coughing for the last couple weeks before that.  She did take an antibiotic for a sinus infection.  She came to the ED last week and was diagnosed with COVID.  She took Paxilovid and completed it yesterday.  She said that today she felt a little bit lightheaded when she stood up.  She denies any vertiginous type symptoms.  She also had a headache although her headache has resolved.  She felt some palpitations.  She is not currently feeling the palpitations.  No associated chest pain or shortness of breath although she has felt like her breathing is not quite right since she had the COVID but does not report any specific shortness of breath.  She denies any known fevers.  No vomiting or diarrhea.  No urinary symptoms.  No abdominal pain.  She just generally feels a little weak.      Home Medications Prior to Admission medications   Medication Sig Start Date End Date Taking? Authorizing Provider  acetaminophen (TYLENOL) 500 MG tablet Take 2 tablets (1,000 mg total) by mouth every 6 (six) hours as needed. 05/22/18   Charlesetta Shanks, MD  aspirin EC 81 MG tablet Take 81 mg by mouth daily. Swallow whole.    [provider]  calcium carbonate (OSCAL) 1500 (600 Ca) MG TABS tablet Take 600 mg of elemental calcium by mouth 2 (two) times daily with a meal.    [provider]  cholecalciferol (VITAMIN D3) 25 MCG (1000 UNIT) tablet Take 2,000 Units by mouth  daily.    [provider]  conjugated estrogens (PREMARIN) vaginal cream Place 1 Applicatorful vaginally 2 (two) times a week.    [provider]  Continuous Blood Gluc Sensor (DEXCOM G6 SENSOR) MISC See admin instructions. 02/20/21   [provider]  Continuous Blood Gluc Transmit (DEXCOM G6 TRANSMITTER) MISC See admin instructions. 02/20/21   [provider]  ELIQUIS 2.5 MG TABS tablet Take 2.5 mg by mouth 2 (two) times daily. 11/03/21   [provider]  EPINEPHrine 0.3 mg/0.3 mL IJ SOAJ injection Inject 0.3 mg into the muscle as needed for anaphylaxis. 08/27/20   Alexandria Lodge, MD  fluticasone (FLONASE) 50 MCG/ACT nasal spray Place 2 sprays into both nostrils daily as needed for allergies or rhinitis.     [provider]  ipratropium (ATROVENT) 0.06 % nasal spray Place 2 sprays into both nostrils 4 (four) times daily. As needed for nasal congestion, runny nose 11/17/21   Lynden Oxford Scales, PA-C  linagliptin (TRADJENTA) 5 MG TABS tablet Take 5 mg by mouth daily.    [provider]  metoprolol succinate (TOPROL-XL) 25 MG 24 hr tablet Take 25 mg by mouth daily.    [provider]  NIFEdipine (PROCARDIA XL/NIFEDICAL-XL) 90 MG 24 hr tablet Take 90 mg by mouth daily.    [provider]  omeprazole (PRILOSEC) 40 MG capsule Take 40 mg by mouth daily.    [provider]  pioglitazone (ACTOS) 15 MG tablet Take 15 mg by mouth daily.    [provider]  rosuvastatin (CRESTOR) 40 MG tablet Take 40 mg by mouth daily.    [provider]      Allergies    Amoxicillin-pot clavulanate, Ciprofloxacin, Clotrimazole, Nystatin, Olmesartan, Hydrochlorothiazide, Metformin, Spironolactone, Telithromycin, and Ace inhibitors    Review of Systems   Review of Systems  Constitutional:  Positive for fatigue. Negative for chills, diaphoresis and fever.  HENT:  Positive for congestion. Negative for rhinorrhea and  sneezing.   Eyes: Negative.   Respiratory:  Positive for cough. Negative for chest tightness and shortness of breath.   Cardiovascular:  Positive for palpitations. Negative for chest pain and leg swelling.  Gastrointestinal:  Negative for abdominal pain, blood in stool, diarrhea, nausea and vomiting.  Genitourinary:  Negative for difficulty urinating, flank pain, frequency and hematuria.  Musculoskeletal:  Negative for arthralgias and back pain.  Skin:  Negative for rash.  Neurological:  Positive for light-headedness. Negative for dizziness, speech difficulty, weakness, numbness and headaches.   Physical Exam Updated Vital Signs BP (!) 170/60 (BP Location: Right Arm)    Pulse 76    Temp 97.6 F (36.4 C) (Oral)    Resp 15    SpO2 99%  Physical Exam Constitutional:      Appearance: She is well-developed.  HENT:     Head: Normocephalic and atraumatic.  Eyes:     Pupils: Pupils are equal, round, and reactive to light.  Cardiovascular:     Rate and Rhythm: Normal rate and regular rhythm.     Heart sounds: Normal heart sounds.  Pulmonary:     Effort: Pulmonary effort is normal. No respiratory distress.     Breath sounds: Normal breath sounds. No wheezing or rales.  Chest:     Chest wall: No tenderness.  Abdominal:     General: Bowel sounds are normal.     Palpations: Abdomen is soft.     Tenderness: There is no abdominal tenderness. There is no guarding or rebound.  Musculoskeletal:        General: Normal range of motion.     Cervical back: Normal range of motion and neck supple.  Lymphadenopathy:     Cervical: No cervical adenopathy.  Skin:    General: Skin is warm and dry.     Findings: No rash.  Neurological:     General: No focal deficit present.     Mental Status: She is alert and oriented to person, place, and time.    ED Results / Procedures / Treatments   Labs (all labs ordered are listed, but only abnormal results are displayed) Labs Reviewed  BASIC METABOLIC  PANEL - Abnormal; Notable for the following components:      Result Value   Potassium 3.1 (*)    Glucose, Bld 144 (*)    Creatinine, Ser 1.80 (*)    GFR, Estimated 28 (*)    All other components within normal limits  CBC WITH DIFFERENTIAL/PLATELET - Abnormal; Notable for the following components:   RBC 3.61 (*)    Hemoglobin 11.3 (*)    HCT 33.0 (*)    All other components within normal limits  URINALYSIS, ROUTINE W REFLEX MICROSCOPIC - Abnormal; Notable for the following components:   Color, Urine STRAW (*)    Hgb urine dipstick TRACE (*)    Leukocytes,Ua TRACE (*)    All other components within normal limits  URINALYSIS, MICROSCOPIC (REFLEX) -  Abnormal; Notable for the following components:   Bacteria, UA RARE (*)    All other components within normal limits    EKG EKG Interpretation  Date/Time:  Saturday December 20 2021 13:48:44 EST Ventricular Rate:  74 PR Interval:  162 QRS Duration: 105 QT Interval:  434 QTC Calculation: 482 R Axis:   -21 Text Interpretation: probable sinus rhythm Borderline left axis deviation Nonspecific repol abnormality, diffuse leads Minimal ST elevation, anterior leads since last tracing no significant change Confirmed by Malvin Johns [phone]) on 12/20/2021 2:17:37 PM  Radiology No results found.  Procedures Procedures    Medications Ordered in ED Medications  potassium chloride SA (KLOR-CON M) CR tablet 40 mEq (40 mEq Oral Given 12/20/21 1456)    ED Course/ Medical Decision Making/ A&P                           Medical Decision Making Amount and/or Complexity of Data Reviewed Labs: ordered.  Risk Prescription drug management.   Patient is 81 year old female who presents with some lightheadedness and palpitations.  She had a recent diagnosis of COVID.  She does not have any significant shortness of breath.  No hypoxia.  Her vital signs are stable.  She is otherwise well-appearing.  She had an EKG which shows what appears to be a  sinus rhythm.  No tachycardia.  No ischemic changes.  She has not had any abnormal heart rhythms or tachycardia episodes while she has been on the monitor in the ED.  Her records were reviewed.  She has completed a course of Paxlovid.  Her blood work was checked today.  Her CBC is nonconcerning.  Her electrolytes show mildly low potassium.  She was given a dose of K-Dur in the emergency department.  It appears similar to prior values.  Her urine does not look consistent with infection and she does not have any urinary symptoms.  She was able to drink fluids here in the ED and she ambulated and said she just felt a little bit woozy but did not have any difficulty with ambulation or unsteadiness/ataxia.  I feel this is likely residual symptoms from her COVID infection.  She was discharged home in good condition.  She was advised to follow-up with her PCP if her symptoms are not improving within the next week.  Return precautions were given.  Final Clinical Impression(s) / ED Diagnoses Final diagnoses:  Palpitations  Lightheadedness    Rx / DC Orders ED Discharge Orders     None         Malvin Johns, MD 12/20/21 1515

## 2021-12-20 NOTE — ED Notes (Signed)
Pt up to bathroom.  States she felt a little dizzy with ambulation.

## 2021-12-20 NOTE — ED Triage Notes (Signed)
Pt reports she was diagnosed with Covid last Sunday. Pt took medication for Covid but when she finished it yesterday she began to have palpitations and a headache.

## 2022-01-30 ENCOUNTER — Telehealth: Payer: Self-pay | Admitting: Internal Medicine

## 2022-01-30 NOTE — Telephone Encounter (Signed)
Patient is requesting a print out of her last lab results please advise  ?

## 2022-01-30 NOTE — Telephone Encounter (Signed)
Lab results placed in mail.  ?

## 2022-02-28 ENCOUNTER — Encounter (HOSPITAL_BASED_OUTPATIENT_CLINIC_OR_DEPARTMENT_OTHER): Payer: Self-pay

## 2022-02-28 ENCOUNTER — Other Ambulatory Visit: Payer: Self-pay

## 2022-02-28 ENCOUNTER — Emergency Department (HOSPITAL_BASED_OUTPATIENT_CLINIC_OR_DEPARTMENT_OTHER)
Admission: EM | Admit: 2022-02-28 | Discharge: 2022-02-28 | Disposition: A | Discharge status: Home / Self Care | Payer: Medicare HMO | Attending: Emergency Medicine | Admitting: Emergency Medicine

## 2022-02-28 ENCOUNTER — Emergency Department (HOSPITAL_BASED_OUTPATIENT_CLINIC_OR_DEPARTMENT_OTHER): Payer: Medicare HMO

## 2022-02-28 DIAGNOSIS — E876 Hypokalemia: Secondary | ICD-10-CM

## 2022-02-28 DIAGNOSIS — Z7982 Long term (current) use of aspirin: Secondary | ICD-10-CM | POA: Insufficient documentation

## 2022-02-28 DIAGNOSIS — S59911A Unspecified injury of right forearm, initial encounter: Secondary | ICD-10-CM | POA: Diagnosis present

## 2022-02-28 DIAGNOSIS — M79651 Pain in right thigh: Secondary | ICD-10-CM | POA: Insufficient documentation

## 2022-02-28 DIAGNOSIS — S52501A Unspecified fracture of the lower end of right radius, initial encounter for closed fracture: Secondary | ICD-10-CM | POA: Diagnosis not present

## 2022-02-28 DIAGNOSIS — Z7901 Long term (current) use of anticoagulants: Secondary | ICD-10-CM | POA: Insufficient documentation

## 2022-02-28 DIAGNOSIS — M79661 Pain in right lower leg: Secondary | ICD-10-CM | POA: Insufficient documentation

## 2022-02-28 DIAGNOSIS — W1830XA Fall on same level, unspecified, initial encounter: Secondary | ICD-10-CM | POA: Diagnosis not present

## 2022-02-28 DIAGNOSIS — I4891 Unspecified atrial fibrillation: Secondary | ICD-10-CM | POA: Insufficient documentation

## 2022-02-28 DIAGNOSIS — E119 Type 2 diabetes mellitus without complications: Secondary | ICD-10-CM | POA: Diagnosis not present

## 2022-02-28 LAB — BASIC METABOLIC PANEL
Anion gap: 11 (ref 5–15)
Anion gap: 8 (ref 5–15)
BUN: 30 mg/dL — ABNORMAL HIGH (ref 8–23)
BUN: 26 mg/dL — ABNORMAL HIGH (ref 8–23)
CO2: 29 mmol/L (ref 22–32)
CO2: 29 mmol/L (ref 22–32)
Calcium: 10.2 mg/dL (ref 8.9–10.3)
Calcium: 9.3 mg/dL (ref 8.9–10.3)
Chloride: 101 mmol/L (ref 98–111)
Chloride: 102 mmol/L (ref 98–111)
Creatinine, Ser: 2.06 mg/dL — ABNORMAL HIGH (ref 0.44–1.00)
Creatinine, Ser: 1.74 mg/dL — ABNORMAL HIGH (ref 0.44–1.00)
GFR, Estimated: 24 mL/min — ABNORMAL LOW (ref >60)
GFR, Estimated: 29 mL/min — ABNORMAL LOW (ref >60)
Glucose, Bld: 160 mg/dL — ABNORMAL HIGH (ref 70–99)
Glucose, Bld: 122 mg/dL — ABNORMAL HIGH (ref 70–99)
Potassium: 2.7 mmol/L — CL (ref 3.5–5.1)
Potassium: 3 mmol/L — ABNORMAL LOW (ref 3.5–5.1)
Sodium: 141 mmol/L (ref 135–145)
Sodium: 139 mmol/L (ref 135–145)

## 2022-02-28 LAB — CBG MONITORING, ED: Glucose-Capillary: 183 mg/dL — ABNORMAL HIGH (ref 70–99)

## 2022-02-28 LAB — CBC WITH DIFFERENTIAL/PLATELET
Abs Immature Granulocytes: 0.02 10*3/uL (ref 0.00–0.07)
Basophils Absolute: 0 10*3/uL (ref 0.0–0.1)
Basophils Relative: 0 %
Eosinophils Absolute: 0 10*3/uL (ref 0.0–0.5)
Eosinophils Relative: 1 %
HCT: 35.5 % — ABNORMAL LOW (ref 36.0–46.0)
Hemoglobin: 12.1 g/dL (ref 12.0–15.0)
Immature Granulocytes: 0 %
Lymphocytes Relative: 18 %
Lymphs Abs: 1.4 10*3/uL (ref 0.7–4.0)
MCH: 31.4 pg (ref 26.0–34.0)
MCHC: 34.1 g/dL (ref 30.0–36.0)
MCV: 92.2 fL (ref 80.0–100.0)
Monocytes Absolute: 0.6 10*3/uL (ref 0.1–1.0)
Monocytes Relative: 7 %
Neutro Abs: 5.9 10*3/uL (ref 1.7–7.7)
Neutrophils Relative %: 74 %
Platelets: 227 10*3/uL (ref 150–400)
RBC: 3.85 MIL/uL — ABNORMAL LOW (ref 3.87–5.11)
RDW: 14 % (ref 11.5–15.5)
WBC: 7.9 10*3/uL (ref 4.0–10.5)
nRBC: 0 % (ref 0.0–0.2)

## 2022-02-28 LAB — MAGNESIUM: Magnesium: 2.1 mg/dL (ref 1.7–2.4)

## 2022-02-28 MED ORDER — POTASSIUM CHLORIDE CRYS ER 20 MEQ PO TBCR: 20.0000 meq | EXTENDED_RELEASE_TABLET | Freq: Every day | ORAL | 0 refills | Status: DC

## 2022-02-28 MED ORDER — POTASSIUM CHLORIDE 10 MEQ/100ML IV SOLN
10.0000 meq | INTRAVENOUS | Status: AC
  Administered 2022-02-28 (×2): 10 meq via INTRAVENOUS
  Filled 2022-02-28 (×2): qty 100

## 2022-02-28 MED ORDER — POTASSIUM CHLORIDE CRYS ER 20 MEQ PO TBCR
40.0000 meq | EXTENDED_RELEASE_TABLET | Freq: Once | ORAL | Status: AC
  Administered 2022-02-28: 40 meq via ORAL
  Filled 2022-02-28: qty 2

## 2022-02-28 MED ORDER — MORPHINE SULFATE (PF) 4 MG/ML IV SOLN
4.0000 mg | Freq: Once | INTRAVENOUS | Status: AC
Start: 2022-02-28 — End: 2022-02-28
  Administered 2022-02-28: 4 mg via INTRAVENOUS
  Filled 2022-02-28: qty 1

## 2022-02-28 MED ORDER — SODIUM CHLORIDE 0.9 % IV SOLN: INTRAVENOUS | Status: DC

## 2022-02-28 NOTE — ED Notes (Signed)
ED Provider at bedside. 

## 2022-02-28 NOTE — ED Provider Notes (Signed)
?[facility]PART[facility] ?Provider Note ? ? ?CSN: [id number] ?Arrival date & time: 02/28/22  0846 ? ?  ? ?History ? ?Chief Complaint  ?Patient presents with  ? Fall  ? ? ?Angie Barrett is a 81 y.o. female with history of type 2 diabetes and A-fib on Eliquis who presents to the ED for evaluation of a mechanical fall that occurred just prior to arrival.  Per patient, she was trying to break the fall of her husband, who was also falling causing her to fall down on her right side, catching herself with her right wrist outstretched in front of her.  Currently she is complaining of severe right wrist pain, right arm pain, right lower extremity pain.  She was ambulatory after the scene but then noted increasing pain and deformity to the lateral shin.  No treatment prior to arrival.  She does deny head injury or loss of consciousness.  Denies numbness or tingling.  She has no other complaints. ? ? ?Fall ? ? ?  ? ?Home Medications ?Prior to Admission medications   ?Medication Sig Start Date End Date Taking? Authorizing Provider  ?potassium chloride SA (KLOR-CON M) 20 MEQ tablet Take 1 tablet (20 mEq total) by mouth daily for 7 days. 02/28/22 03/07/22 Yes Kathe Becton R, PA-C  ?acetaminophen (TYLENOL) 500 MG tablet Take 2 tablets (1,000 mg total) by mouth every 6 (six) hours as needed. 05/22/18   Charlesetta Shanks, MD  ?aspirin EC 81 MG tablet Take 81 mg by mouth daily. Swallow whole.    [provider]  ?calcium carbonate (OSCAL) 1500 (600 Ca) MG TABS tablet Take 600 mg of elemental calcium by mouth 2 (two) times daily with a meal.    [provider]  ?cholecalciferol (VITAMIN D3) 25 MCG (1000 UNIT) tablet Take 2,000 Units by mouth daily.    [provider]  ?conjugated estrogens (PREMARIN) vaginal cream Place 1 Applicatorful vaginally 2 (two) times a week.    [provider]  ?Continuous Blood Gluc Sensor (DEXCOM G6 SENSOR) MISC See admin instructions. 02/20/21   [provider]  ?Continuous Blood Gluc Transmit (DEXCOM G6 TRANSMITTER) MISC See admin instructions. 02/20/21   [provider]  ?ELIQUIS 2.5 MG TABS tablet Take 2.5 mg by mouth 2 (two) times daily. 11/03/21   [provider]  ?EPINEPHrine 0.3 mg/0.3 mL IJ SOAJ injection Inject 0.3 mg into the muscle as needed for anaphylaxis. 08/27/20   Alexandria Lodge, MD  ?fluticasone Asencion Islam) 50 MCG/ACT nasal spray Place 2 sprays into both nostrils daily as needed for allergies or rhinitis.     [provider]  ?ipratropium (ATROVENT) 0.06 % nasal spray Place 2 sprays into both nostrils 4 (four) times daily. As needed for nasal congestion, runny nose 11/17/21   Lynden Oxford Scales, PA-C  ?linagliptin (TRADJENTA) 5 MG TABS tablet Take 5 mg by mouth daily.    [provider]  ?metoprolol succinate (TOPROL-XL) 25 MG 24 hr tablet Take 25 mg by mouth daily.    [provider]  ?NIFEdipine (PROCARDIA XL/NIFEDICAL-XL) 90 MG 24 hr tablet Take 90 mg by mouth daily.    [provider]  ?omeprazole (PRILOSEC) 40 MG capsule Take 40 mg by mouth daily.    [provider]  ?pioglitazone (ACTOS) 15 MG tablet Take 15 mg by mouth daily.    [provider]  ?rosuvastatin (CRESTOR) 40 MG tablet Take 40 mg by mouth daily.    [provider]  ?   ? ?  Allergies    ?Amoxicillin-pot clavulanate, Ciprofloxacin, Clotrimazole, Nystatin, Olmesartan, Hydrochlorothiazide, Metformin, Spironolactone, Telithromycin, and Ace inhibitors   ? ?Review of Systems   ?Review of Systems ? ?Physical Exam ?Updated Vital Signs ?BP (!) 145/56   Pulse 61   Temp 98.2 ?F (36.8 ?C) (Oral)   Resp 16   Ht 5\' 3"  (1.6 m)   Wt 69.4 kg   SpO2 95%   BMI 27.10 kg/m?  ?Physical Exam ?Vitals and nursing note reviewed.  ?Constitutional:   ?   General: She is not in acute distress. ?   Appearance: She is not ill-appearing.  ?HENT:  ?   Head: Atraumatic.  ?Eyes:  ?   Conjunctiva/sclera: Conjunctivae normal.   ?Cardiovascular:  ?   Rate and Rhythm: Normal rate and regular rhythm.  ?   Pulses: Normal pulses.  ?   Heart sounds: No murmur heard. ?Pulmonary:  ?   Effort: Pulmonary effort is normal. No respiratory distress.  ?   Breath sounds: Normal breath sounds.  ?Abdominal:  ?   General: Abdomen is flat. There is no distension.  ?   Palpations: Abdomen is soft.  ?   Tenderness: There is no abdominal tenderness.  ?Musculoskeletal:     ?   General: Normal range of motion.  ?   Right wrist: Swelling, deformity and bony tenderness present. Normal pulse.  ?   Left wrist: Normal pulse.  ?   Cervical back: Normal range of motion.  ?   Comments: Right shoulder without deformity or pain; normal ROM. ? ?Right hip without tenderness to palpation, good range of motion.  Mild tenderness to the lateral thigh without obvious swelling or deformities.  Knee with range of motion intact, minimal pain.  Area of swelling at the mid lateral fibula, tender to palpation.  DP pulses intact.  Right ankle without acute swelling or tenderness. ? ?Palpable deformity to the right wrist with moderate swelling, pulses intact; good thumb to pinky opposition, difficulty with finger extension although manageable  ?Skin: ?   General: Skin is warm and dry.  ?   Capillary Refill: Capillary refill takes less than 2 seconds.  ?Neurological:  ?   General: No focal deficit present.  ?   Mental Status: She is alert.  ?Psychiatric:     ?   Mood and Affect: Mood normal.  ? ? ?ED Results / Procedures / Treatments   ?Labs ?(all labs ordered are listed, but only abnormal results are displayed) ?Labs Reviewed  ?BASIC METABOLIC PANEL - Abnormal; Notable for the following components:  ?    Result Value  ? Potassium 2.7 (*)   ? Glucose, Bld 160 (*)   ? BUN 30 (*)   ? Creatinine, Ser 2.06 (*)   ? GFR, Estimated 24 (*)   ? All other components within normal limits  ?CBC WITH DIFFERENTIAL/PLATELET - Abnormal; Notable for the following components:  ? RBC 3.85 (*)   ? HCT 35.5  (*)   ? All other components within normal limits  ?BASIC METABOLIC PANEL - Abnormal; Notable for the following components:  ? Potassium 3.0 (*)   ? Glucose, Bld 122 (*)   ? BUN 26 (*)   ? Creatinine, Ser 1.74 (*)   ? GFR, Estimated 29 (*)   ? All other components within normal limits  ?CBG MONITORING, ED - Abnormal; Notable for the following components:  ? Glucose-Capillary 183 (*)   ? All other components within normal limits  ?MAGNESIUM  ? ? ?  EKG ?EKG Interpretation ? ?Date/Time:  Saturday February 28 2022 10:37:04 EDT ?Ventricular Rate:  49 ?PR Interval:  162 ?QRS Duration: 111 ?QT Interval:  490 ?QTC Calculation: 443 ?R Axis:   60 ?Text Interpretation: Bradycardia with irregular rate Probable LVH with secondary repol abnrm ST depression, consider ischemia, diffuse lds Sinus bradycardia Confirmed by Lavenia Atlas [phone]) on 02/28/2022 11:01:38 AM ? ?Radiology ?DG Forearm Right ? ?Result Date: 02/28/2022 ?CLINICAL DATA:  Fall.  Wrist pain. EXAM: RIGHT WRIST - COMPLETE 3+ VIEW; RIGHT FOREARM - 2 VIEW COMPARISON:  None. FINDINGS: Comminuted fracture of the distal radial metaphysis demonstrates dorsal angulation. Intra-articular extension noted. Carpal bones are intact. Wrist joint is located. Nondisplaced ulnar styloid fracture is present. Proximal radius and ulna are within normal limits. Vascular calcifications noted. IMPRESSION: 1. Comminuted fracture of the distal radial metaphysis with dorsal angulation. 2. Nondisplaced ulnar styloid fracture. Electronically Signed   By: San Morelle M.D.   On: 02/28/2022 10:26  ? ?DG Wrist Complete Right ? ?Result Date: 02/28/2022 ?CLINICAL DATA:  Fall.  Wrist pain. EXAM: RIGHT WRIST - COMPLETE 3+ VIEW; RIGHT FOREARM - 2 VIEW COMPARISON:  None. FINDINGS: Comminuted fracture of the distal radial metaphysis demonstrates dorsal angulation. Intra-articular extension noted. Carpal bones are intact. Wrist joint is located. Nondisplaced ulnar styloid fracture is present. Proximal  radius and ulna are within normal limits. Vascular calcifications noted. IMPRESSION: 1. Comminuted fracture of the distal radial metaphysis with dorsal angulation. 2. Nondisplaced ulnar styloid fracture. Electro

## 2022-02-28 NOTE — ED Triage Notes (Signed)
Pt arrives with reports of a fall today when trying to stop her husband from falling. Husband drove her to ED today. Pt states she is hurting on her "right side from shoulder all the way down". Pt landed on a step. Denies hitting head, no LOC, does take blood thinner eliquis. ?

## 2022-02-28 NOTE — Discharge Instructions (Signed)
You suffered a fracture of your right wrist and you will need to follow up with the hand surgeon early next week for evaluation and to possible receive a more long term cast.  ? ?Additionally, your potassium was quite low today. We managed to increase that up a decent amount, but I have sent you home with oral potassium supplements and I would like you to follow up with your PCP on Monday to have this rechecked. Please return if you develop palpitations, weakness, cramps.  ?

## 2022-04-15 ENCOUNTER — Emergency Department (HOSPITAL_BASED_OUTPATIENT_CLINIC_OR_DEPARTMENT_OTHER)
Admission: EM | Admit: 2022-04-15 | Discharge: 2022-04-15 | Disposition: A | Discharge status: Home / Self Care | Payer: Medicare HMO | Attending: Emergency Medicine | Admitting: Emergency Medicine

## 2022-04-15 ENCOUNTER — Other Ambulatory Visit: Payer: Self-pay

## 2022-04-15 ENCOUNTER — Emergency Department (HOSPITAL_BASED_OUTPATIENT_CLINIC_OR_DEPARTMENT_OTHER): Payer: Medicare HMO

## 2022-04-15 ENCOUNTER — Encounter (HOSPITAL_BASED_OUTPATIENT_CLINIC_OR_DEPARTMENT_OTHER): Payer: Self-pay | Admitting: Emergency Medicine

## 2022-04-15 DIAGNOSIS — R0789 Other chest pain: Secondary | ICD-10-CM | POA: Insufficient documentation

## 2022-04-15 DIAGNOSIS — N189 Chronic kidney disease, unspecified: Secondary | ICD-10-CM | POA: Diagnosis not present

## 2022-04-15 DIAGNOSIS — Z79899 Other long term (current) drug therapy: Secondary | ICD-10-CM | POA: Insufficient documentation

## 2022-04-15 DIAGNOSIS — R0602 Shortness of breath: Secondary | ICD-10-CM | POA: Insufficient documentation

## 2022-04-15 DIAGNOSIS — R42 Dizziness and giddiness: Secondary | ICD-10-CM | POA: Diagnosis not present

## 2022-04-15 DIAGNOSIS — R062 Wheezing: Secondary | ICD-10-CM | POA: Insufficient documentation

## 2022-04-15 DIAGNOSIS — R2243 Localized swelling, mass and lump, lower limb, bilateral: Secondary | ICD-10-CM | POA: Diagnosis not present

## 2022-04-15 DIAGNOSIS — I130 Hypertensive heart and chronic kidney disease with heart failure and stage 1 through stage 4 chronic kidney disease, or unspecified chronic kidney disease: Secondary | ICD-10-CM | POA: Insufficient documentation

## 2022-04-15 DIAGNOSIS — Z7982 Long term (current) use of aspirin: Secondary | ICD-10-CM | POA: Insufficient documentation

## 2022-04-15 DIAGNOSIS — I509 Heart failure, unspecified: Secondary | ICD-10-CM | POA: Insufficient documentation

## 2022-04-15 LAB — TROPONIN I (HIGH SENSITIVITY)
Troponin I (High Sensitivity): 10 ng/L (ref <18)
Troponin I (High Sensitivity): 11 ng/L (ref <18)

## 2022-04-15 LAB — BASIC METABOLIC PANEL
Anion gap: 12 (ref 5–15)
BUN: 31 mg/dL — ABNORMAL HIGH (ref 8–23)
CO2: 26 mmol/L (ref 22–32)
Calcium: 9.6 mg/dL (ref 8.9–10.3)
Chloride: 99 mmol/L (ref 98–111)
Creatinine, Ser: 1.85 mg/dL — ABNORMAL HIGH (ref 0.44–1.00)
GFR, Estimated: 27 mL/min — ABNORMAL LOW (ref >60)
Glucose, Bld: 125 mg/dL — ABNORMAL HIGH (ref 70–99)
Potassium: 3.5 mmol/L (ref 3.5–5.1)
Sodium: 137 mmol/L (ref 135–145)

## 2022-04-15 LAB — CBC WITH DIFFERENTIAL/PLATELET
Abs Immature Granulocytes: 0.01 10*3/uL (ref 0.00–0.07)
Basophils Absolute: 0 10*3/uL (ref 0.0–0.1)
Basophils Relative: 1 %
Eosinophils Absolute: 0.1 10*3/uL (ref 0.0–0.5)
Eosinophils Relative: 1 %
HCT: 34.2 % — ABNORMAL LOW (ref 36.0–46.0)
Hemoglobin: 11.7 g/dL — ABNORMAL LOW (ref 12.0–15.0)
Immature Granulocytes: 0 %
Lymphocytes Relative: 39 %
Lymphs Abs: 2.2 10*3/uL (ref 0.7–4.0)
MCH: 31.5 pg (ref 26.0–34.0)
MCHC: 34.2 g/dL (ref 30.0–36.0)
MCV: 91.9 fL (ref 80.0–100.0)
Monocytes Absolute: 0.5 10*3/uL (ref 0.1–1.0)
Monocytes Relative: 10 %
Neutro Abs: 2.8 10*3/uL (ref 1.7–7.7)
Neutrophils Relative %: 49 %
Platelets: 260 10*3/uL (ref 150–400)
RBC: 3.72 MIL/uL — ABNORMAL LOW (ref 3.87–5.11)
RDW: 13.3 % (ref 11.5–15.5)
WBC: 5.7 10*3/uL (ref 4.0–10.5)
nRBC: 0 % (ref 0.0–0.2)

## 2022-04-15 LAB — D-DIMER, QUANTITATIVE: D-Dimer, Quant: 1.59 ug/mL-FEU — ABNORMAL HIGH (ref 0.00–0.50)

## 2022-04-15 MED ORDER — ALBUTEROL SULFATE HFA 108 (90 BASE) MCG/ACT IN AERS
2.0000 | INHALATION_SPRAY | RESPIRATORY_TRACT | Status: DC | PRN
  Administered 2022-04-15: 2 via RESPIRATORY_TRACT
  Filled 2022-04-15: qty 6.7

## 2022-04-15 NOTE — ED Provider Notes (Signed)
[facility] [facility] EMERGENCY DEPARTMENT Provider Note   CSN: [id number] Arrival date & time: 04/15/22  1418     History  Chief Complaint  Patient presents with   Shortness of Breath    Angie Barrett is a 81 y.o. female.  Patient is an 81 year old female with past medical history of hypertension, hyperlipidemia, congestive heart failure with ejection fraction*on recent echocardiogram, CKD, and edema presenting for complaints of shortness of breath.  Patient admits to intermittent shortness of breath over the last several days.  Today it was accompanied by lightheadedness, dizziness, and chest tightness.  Denies any fevers, chills, coughing. Denies history of COPD or wheezing.  Admits to bilateral lower extremity swelling but states it is improved from her baseline.  No previous history of DVT or PE.  Patient on Eliquis and metoprolol for A-fib.  The history is provided by the patient. No language interpreter was used.  Shortness of Breath Associated symptoms: no abdominal pain, no chest pain, no cough, no ear pain, no fever, no rash, no sore throat and no vomiting       Home Medications Prior to Admission medications   Medication Sig Start Date End Date Taking? Authorizing Provider  acetaminophen (TYLENOL) 500 MG tablet Take 2 tablets (1,000 mg total) by mouth every 6 (six) hours as needed. 05/22/18   Charlesetta Shanks, MD  aspirin EC 81 MG tablet Take 81 mg by mouth daily. Swallow whole.    [provider]  calcium carbonate (OSCAL) 1500 (600 Ca) MG TABS tablet Take 600 mg of elemental calcium by mouth 2 (two) times daily with a meal.    [provider]  cholecalciferol (VITAMIN D3) 25 MCG (1000 UNIT) tablet Take 2,000 Units by mouth daily.    [provider]  conjugated estrogens (PREMARIN) vaginal cream Place 1 Applicatorful vaginally 2 (two) times a week.    [provider]  Continuous Blood Gluc Sensor (DEXCOM G6 SENSOR) MISC See admin  instructions. 02/20/21   [provider]  Continuous Blood Gluc Transmit (DEXCOM G6 TRANSMITTER) MISC See admin instructions. 02/20/21   [provider]  ELIQUIS 2.5 MG TABS tablet Take 2.5 mg by mouth 2 (two) times daily. 11/03/21   [provider]  EPINEPHrine 0.3 mg/0.3 mL IJ SOAJ injection Inject 0.3 mg into the muscle as needed for anaphylaxis. 08/27/20   Alexandria Lodge, MD  fluticasone (FLONASE) 50 MCG/ACT nasal spray Place 2 sprays into both nostrils daily as needed for allergies or rhinitis.     [provider]  ipratropium (ATROVENT) 0.06 % nasal spray Place 2 sprays into both nostrils 4 (four) times daily. As needed for nasal congestion, runny nose 11/17/21   Lynden Oxford Scales, PA-C  linagliptin (TRADJENTA) 5 MG TABS tablet Take 5 mg by mouth daily.    [provider]  metoprolol succinate (TOPROL-XL) 25 MG 24 hr tablet Take 25 mg by mouth daily.    [provider]  NIFEdipine (PROCARDIA XL/NIFEDICAL-XL) 90 MG 24 hr tablet Take 90 mg by mouth daily.    [provider]  omeprazole (PRILOSEC) 40 MG capsule Take 40 mg by mouth daily.    [provider]  pioglitazone (ACTOS) 15 MG tablet Take 15 mg by mouth daily.    [provider]  potassium chloride SA (KLOR-CON M) 20 MEQ tablet Take 1 tablet (20 mEq total) by mouth daily for 7 days. 02/28/22 03/07/22  Tonye Pearson, PA-C  rosuvastatin (CRESTOR) 40 MG tablet Take 40 mg by  mouth daily.    [provider]      Allergies    Amoxicillin-pot clavulanate, Ciprofloxacin, Clotrimazole, Nystatin, Olmesartan, Hydrochlorothiazide, Metformin, Spironolactone, Telithromycin, and Ace inhibitors    Review of Systems   Review of Systems  Constitutional:  Negative for chills and fever.  HENT:  Negative for ear pain and sore throat.   Eyes:  Negative for pain and visual disturbance.  Respiratory:  Positive for chest tightness and shortness of breath. Negative for  cough.   Cardiovascular:  Negative for chest pain and palpitations.  Gastrointestinal:  Negative for abdominal pain and vomiting.  Genitourinary:  Negative for dysuria and hematuria.  Musculoskeletal:  Negative for arthralgias and back pain.  Skin:  Negative for color change and rash.  Neurological:  Positive for dizziness and light-headedness. Negative for seizures and syncope.  All other systems reviewed and are negative.  Physical Exam Updated Vital Signs BP (!) 176/69   Pulse 63   Temp 98.4 F (36.9 C) (Oral)   Resp 20   Ht 5\' 4"  (1.626 m)   Wt 68 kg   SpO2 100%   BMI 25.75 kg/m  Physical Exam Vitals and nursing note reviewed.  Constitutional:      General: She is not in acute distress.    Appearance: She is well-developed.  HENT:     Head: Normocephalic and atraumatic.  Eyes:     Conjunctiva/sclera: Conjunctivae normal.  Cardiovascular:     Rate and Rhythm: Normal rate and regular rhythm.     Heart sounds: No murmur heard. Pulmonary:     Effort: Pulmonary effort is normal. No tachypnea, bradypnea, accessory muscle usage or respiratory distress.     Breath sounds: Wheezing present.  Abdominal:     Palpations: Abdomen is soft.     Tenderness: There is no abdominal tenderness.  Musculoskeletal:        General: No swelling.     Cervical back: Neck supple.     Right lower leg: 3+ Pitting Edema present.     Left lower leg: 3+ Pitting Edema present.  Skin:    General: Skin is warm and dry.     Capillary Refill: Capillary refill takes less than 2 seconds.  Neurological:     Mental Status: She is alert.  Psychiatric:        Mood and Affect: Mood normal.    ED Results / Procedures / Treatments   Labs (all labs ordered are listed, but only abnormal results are displayed) Labs Reviewed  CBC WITH DIFFERENTIAL/PLATELET - Abnormal; Notable for the following components:      Result Value   RBC 3.72 (*)    Hemoglobin 11.7 (*)    HCT 34.2 (*)    All other components  within normal limits  BASIC METABOLIC PANEL - Abnormal; Notable for the following components:   Glucose, Bld 125 (*)    BUN 31 (*)    Creatinine, Ser 1.85 (*)    GFR, Estimated 27 (*)    All other components within normal limits  D-DIMER, QUANTITATIVE - Abnormal; Notable for the following components:   D-Dimer, Quant 1.59 (*)    All other components within normal limits  TROPONIN I (HIGH SENSITIVITY)  TROPONIN I (HIGH SENSITIVITY)    EKG None  Radiology DG Chest 2 View  Result Date: 04/15/2022 CLINICAL DATA:  Shortness of breath and chest tightness beginning today EXAM: CHEST - 2 VIEW COMPARISON:  12/14/2021 FINDINGS: Normal heart size, mediastinal contours, and pulmonary vascularity. Atherosclerotic  calcification aorta and questionably coronary arteries. Lungs clear. No infiltrate, pleural effusion, or pneumothorax. Monitoring device projects over LEFT upper quadrant. No acute osseous findings. IMPRESSION: No acute abnormalities. Question coronary arterial calcification. Aortic Atherosclerosis (ICD10-I70.0). Electronically Signed   By: Lavonia Dana M.D.   On: 04/15/2022 15:07    Procedures Procedures    Medications Ordered in ED Medications  albuterol (VENTOLIN HFA) 108 (90 Base) MCG/ACT inhaler 2 puff (2 puffs Inhalation Given 04/15/22 1448)    ED Course/ Medical Decision Making/ A&P                           Medical Decision Making Amount and/or Complexity of Data Reviewed Labs: ordered. Radiology: ordered.  Risk Prescription drug management.   23:75 PM 81 year old female with past medical history of hypertension, hyperlipidemia, congestive heart failure with ejection fraction*on recent echocardiogram, CKD, and edema presenting for complaints of shortness of breath.  Patient is alert and oriented x3, no acute distress, afebrile, stable vital signs.  Physical exam demonstrates minimal wheezing in all lung fields.  Otherwise no signs of respiratory distress.  No hypoxia or  retractions.  Patient has bilateral pitting edema +3 and lower extremities.  EKG demonstrates normal sinus rhythm and rate.  Troponin, electrolytes, chest x-ray within normal limits.   Patient explains chest tightness feeling like a bandlike sensation around her chest.  Shortness of breath with exertion.  No hypoxia or respiratory distress at this time however have concerns for small pulmonary embolism.  D-dimer elevated.  Unable to order CT PE due to chronic kidney disease and GFR of 27.  Patient is not on dialysis.  Commend VQ scan.  Plan for patient to be transferred to [facility] with the [facility] ED to ED for VQ scan.  Patient requesting to be discharged at this time stating she has appoint with her primary care physician who can order an outpatient.  Patient recommended for VQ scan however it is understandable.  Patient has no hypoxia, respiratory distress, or unstable vitals.  Is well-appearing on exam.  Recommended for close follow-up with PCP. commended for close return to emergency department immediately for any worsening concerning signs or symptoms.        Final Clinical Impression(s) / ED Diagnoses Final diagnoses:  Chest tightness  SOB (shortness of breath)    Rx / DC Orders ED Discharge Orders     None         Lianne Cure, DO 32/67/12 1834

## 2022-04-15 NOTE — ED Notes (Signed)
Pt wheeled out of ED via wheelchair. Pt verbalized understanding of d/c instructions, AMA and follow up care.

## 2022-04-15 NOTE — ED Triage Notes (Signed)
Shortness of breath started today , Hx Afib . On Eliquis.  Denies chest pain .

## 2022-04-15 NOTE — ED Notes (Signed)
Pt to bathroom in wheelchair

## 2022-04-15 NOTE — ED Notes (Signed)
ED Provider at bedside. 

## 2022-04-15 NOTE — Discharge Instructions (Addendum)
Today you were recommended for a CT scan of the chest to rule out a pulmonary embolism due to your symptoms of bandlike sensation around the chest, chest tightness, and shortness of breath with exertion.  However we were not able to order that for you due to your inability to get IV contrast due to history of kidney dysfunction.  The other scan that you can get to rule out a pulmonary embolism is a VQ scan.  Follow closely with your primary care physician for this.  Return to emergency department immediately if symptoms worsen in any way.

## 2022-06-29 IMAGING — CT CT HEAD W/O CM
3 series · 16 of 47 positions shown, 19 images · non-contrast
Comparison: Head CT 08/16/2019

CLINICAL DATA: Hypertension and headache since this morning.

EXAM:
CT HEAD WITHOUT CONTRAST
TECHNIQUE: Contiguous axial images were obtained from the base of the skull
through the vertex without intravenous contrast.

[Series 2: head wo · axial · 0.39mm/px · z∈[-154,-29]mm · 10 of 31 slices shown, 13 images]
[im 3/31  brain]
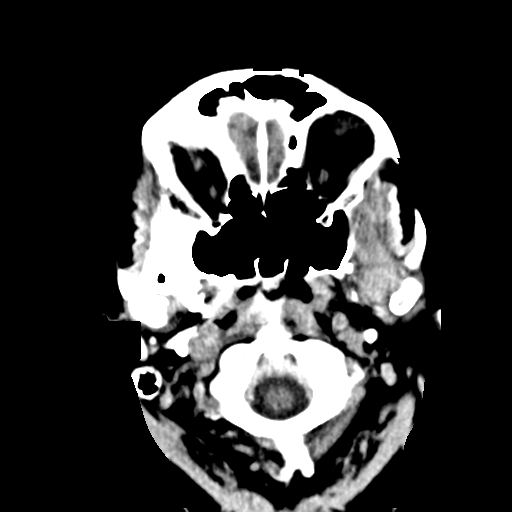
[im 3/31  bone]
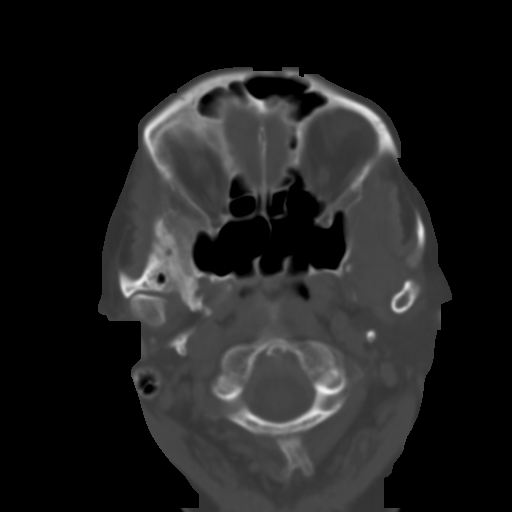
[im 6/31  brain]
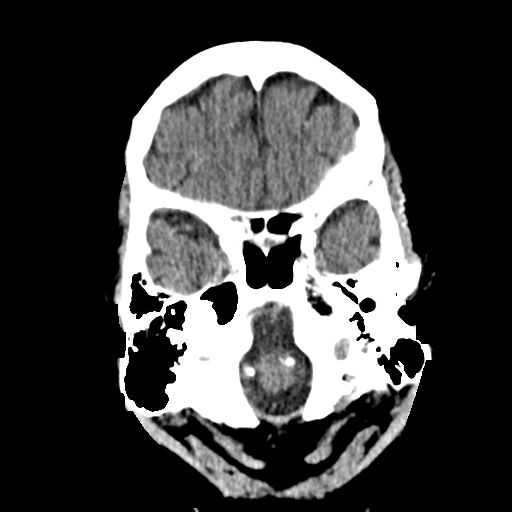
[im 9/31  brain]
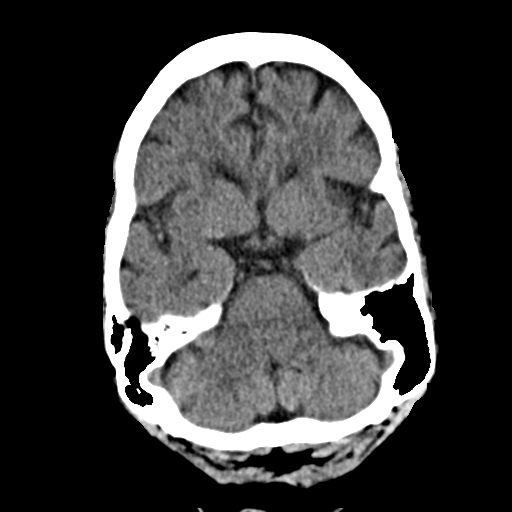
[im 11/31  brain]
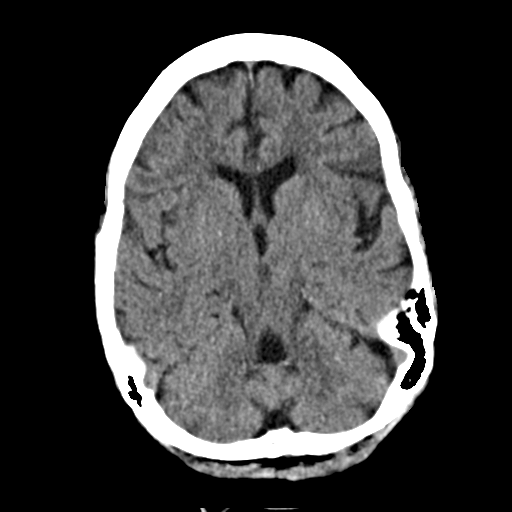
[im 14/31  brain]
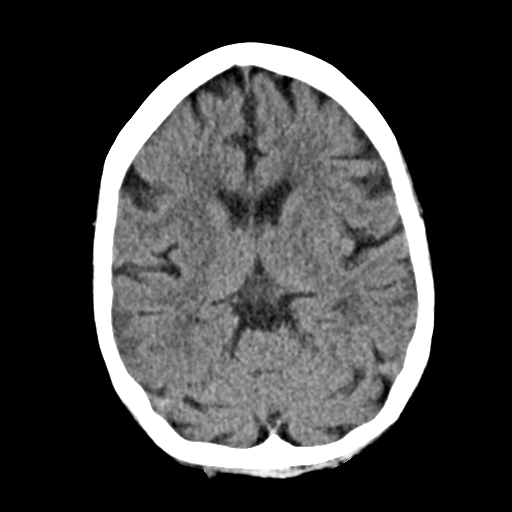
[im 14/31  bone]
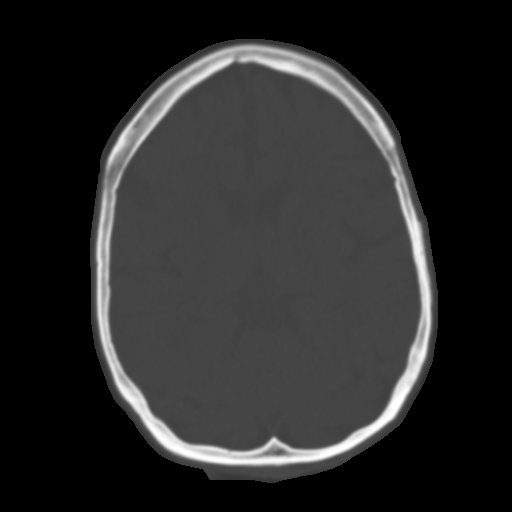
[im 17/31  brain]
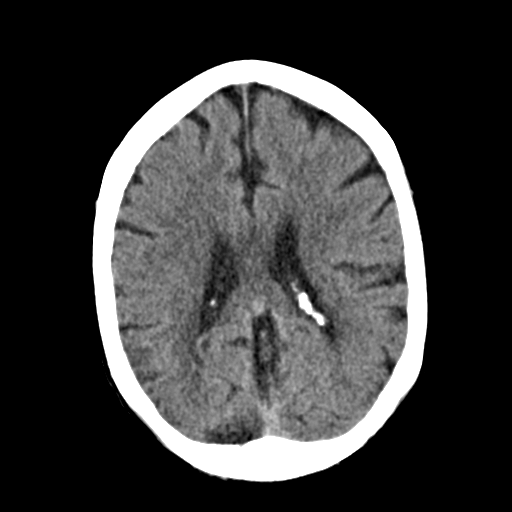
[im 20/31  brain]
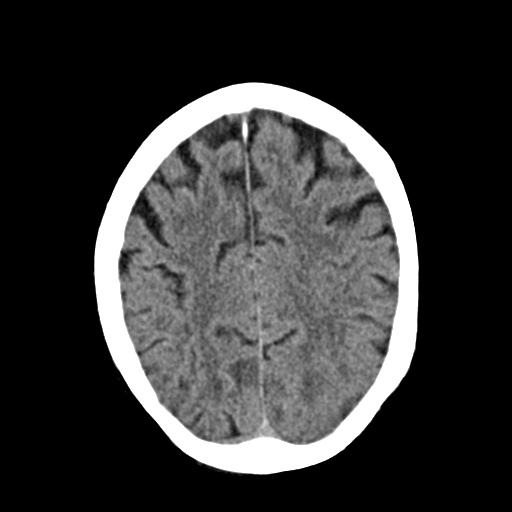
[im 23/31  brain]
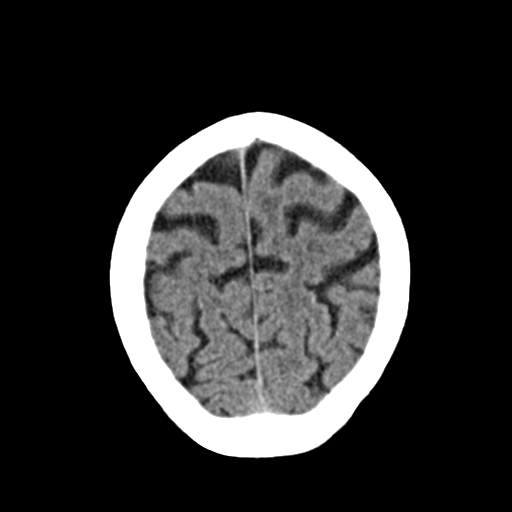
[im 25/31  brain]
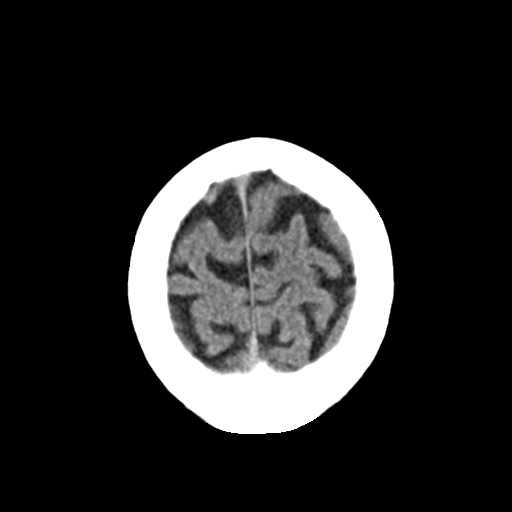
[im 25/31  bone]
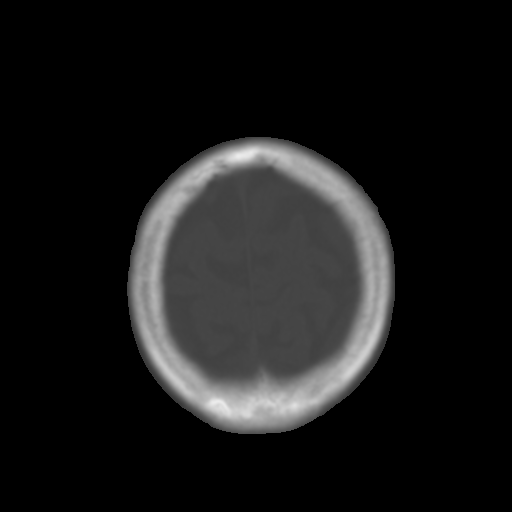
[im 28/31  brain]
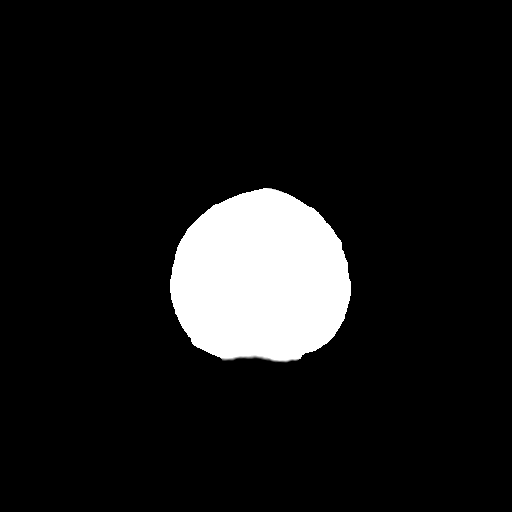

[Series 4: coronal soft · coronal · 0.31mm/px · 3 of 65 slices shown]
[im 22/65  brain]
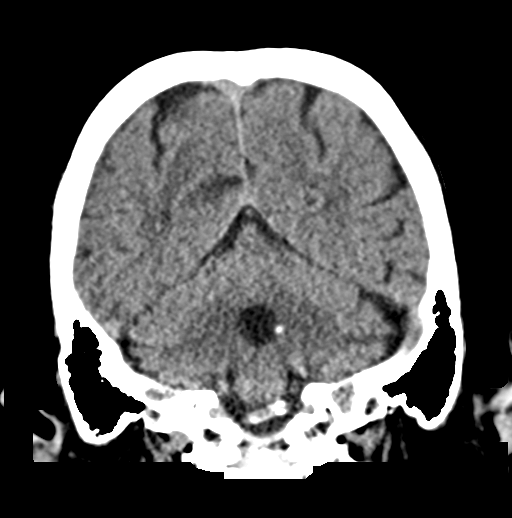
[im 29/65  brain]
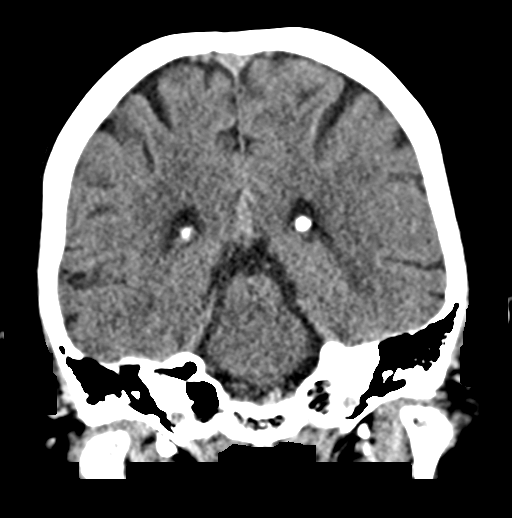
[im 36/65  brain]
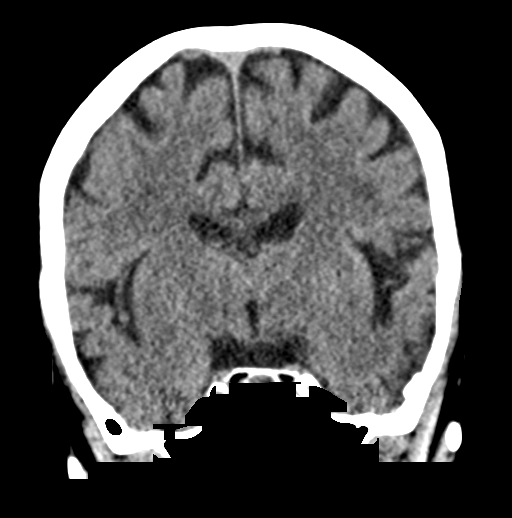

[Series 5: sag soft · sagittal · 0.31mm/px · 3 of 49 slices shown]
[im 17/49  brain]
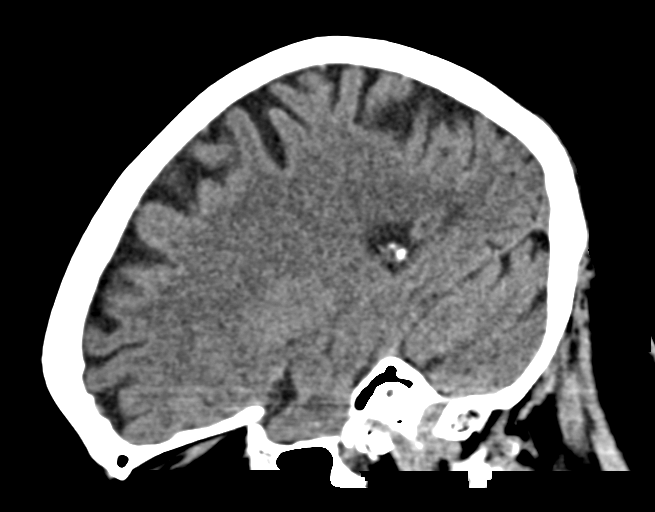
[im 25/49  brain]
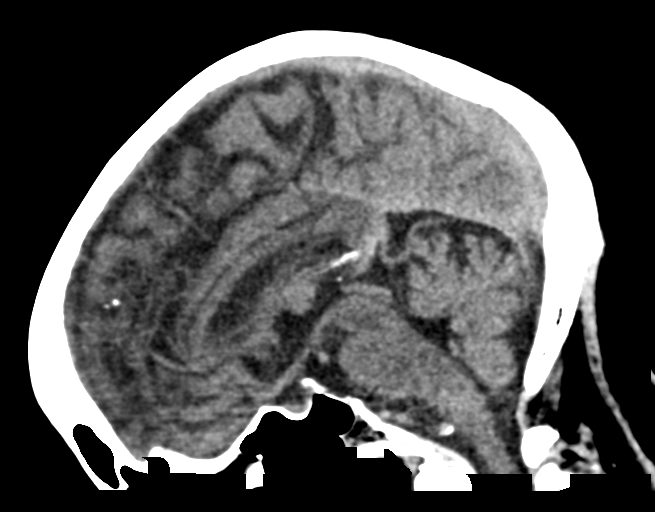
[im 33/49  brain]
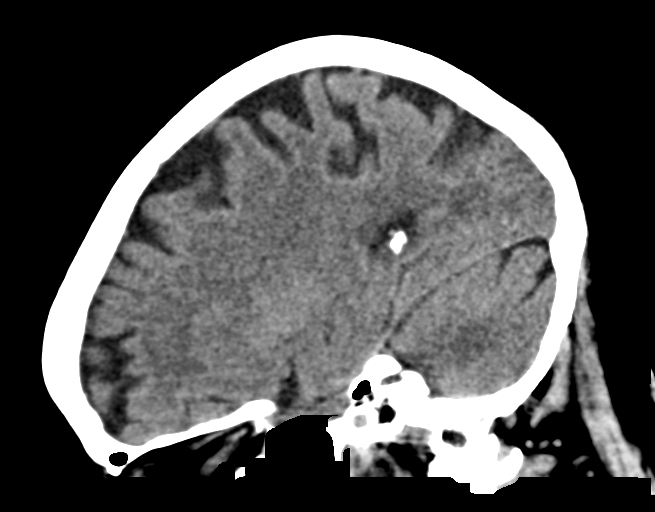

[16 of 47 positions shown; findings below may reference images not displayed]

FINDINGS: Brain: Stable age related cerebral atrophy, ventriculomegaly and
periventricular white matter disease. No extra-axial fluid
collections are identified. No CT findings for acute hemispheric
infarction or intracranial hemorrhage. No mass lesions. The
brainstem and cerebellum are normal.

Vascular: Stable vascular calcifications. No aneurysm or hyperdense
vessels.

Skull: No skull fracture or bone lesions.

Sinuses/Orbits: The paranasal sinuses and mastoid air cells are
clear. The globes are intact.

Other: No scalp lesions or scalp hematoma.
IMPRESSION: 1. Stable age related cerebral atrophy, ventriculomegaly and
periventricular white matter disease.
2. No acute intracranial findings or mass lesions.

## 2022-10-04 ENCOUNTER — Emergency Department (HOSPITAL_BASED_OUTPATIENT_CLINIC_OR_DEPARTMENT_OTHER)
Admission: EM | Admit: 2022-10-04 | Discharge: 2022-10-04 | Disposition: A | Discharge status: Home / Self Care | Payer: Medicare HMO | Attending: Emergency Medicine | Admitting: Emergency Medicine

## 2022-10-04 ENCOUNTER — Encounter (HOSPITAL_BASED_OUTPATIENT_CLINIC_OR_DEPARTMENT_OTHER): Payer: Self-pay | Admitting: Emergency Medicine

## 2022-10-04 ENCOUNTER — Other Ambulatory Visit: Payer: Self-pay

## 2022-10-04 ENCOUNTER — Emergency Department (HOSPITAL_BASED_OUTPATIENT_CLINIC_OR_DEPARTMENT_OTHER): Payer: Medicare HMO

## 2022-10-04 DIAGNOSIS — I129 Hypertensive chronic kidney disease with stage 1 through stage 4 chronic kidney disease, or unspecified chronic kidney disease: Secondary | ICD-10-CM | POA: Diagnosis not present

## 2022-10-04 DIAGNOSIS — Z79899 Other long term (current) drug therapy: Secondary | ICD-10-CM | POA: Insufficient documentation

## 2022-10-04 DIAGNOSIS — S0003XA Contusion of scalp, initial encounter: Secondary | ICD-10-CM | POA: Diagnosis not present

## 2022-10-04 DIAGNOSIS — E1122 Type 2 diabetes mellitus with diabetic chronic kidney disease: Secondary | ICD-10-CM | POA: Insufficient documentation

## 2022-10-04 DIAGNOSIS — Z7984 Long term (current) use of oral hypoglycemic drugs: Secondary | ICD-10-CM | POA: Insufficient documentation

## 2022-10-04 DIAGNOSIS — N189 Chronic kidney disease, unspecified: Secondary | ICD-10-CM | POA: Insufficient documentation

## 2022-10-04 DIAGNOSIS — Z7982 Long term (current) use of aspirin: Secondary | ICD-10-CM | POA: Insufficient documentation

## 2022-10-04 DIAGNOSIS — Z7901 Long term (current) use of anticoagulants: Secondary | ICD-10-CM | POA: Insufficient documentation

## 2022-10-04 DIAGNOSIS — S0990XA Unspecified injury of head, initial encounter: Secondary | ICD-10-CM | POA: Diagnosis present

## 2022-10-04 DIAGNOSIS — W2209XA Striking against other stationary object, initial encounter: Secondary | ICD-10-CM | POA: Diagnosis not present

## 2022-10-04 NOTE — ED Triage Notes (Signed)
Pt reports hit head on TV about an hour ago; no LOC; sts there is knot; takes Eliquis

## 2022-10-04 NOTE — ED Provider Notes (Signed)
[facility] EMERGENCY DEPARTMENT Provider Note   CSN: [id number] Arrival date & time: 10/04/22  1558     History  Chief Complaint  Patient presents with   Head Injury    Margia Wiesen is a 81 y.o. female.  81 year old female with past medical history of A-fib on chronic Eliquis presents today for evaluation of head injury.  She states she was leaning over to adjust the thermostat and when she stood up she hit her head on the TV that was mounted on the wall.  She denies falling, loss of consciousness, vision change.  She does have a "knot" on the left side of her head.  Denies any other complaints.  Denies headache.  In triage patient's blood pressure was noted to be 216/68.  She denies chest pain, shortness of breath, vision change, balance issues.  Patient is on multiple antihypertensives which she reports compliance with.  The history is provided by the patient. No language interpreter was used.       Home Medications Prior to Admission medications   Medication Sig Start Date End Date Taking? Authorizing Provider  acetaminophen (TYLENOL) 500 MG tablet Take 2 tablets (1,000 mg total) by mouth every 6 (six) hours as needed. 05/22/18   Charlesetta Shanks, MD  aspirin EC 81 MG tablet Take 81 mg by mouth daily. Swallow whole.    [provider]  calcium carbonate (OSCAL) 1500 (600 Ca) MG TABS tablet Take 600 mg of elemental calcium by mouth 2 (two) times daily with a meal.    [provider]  cholecalciferol (VITAMIN D3) 25 MCG (1000 UNIT) tablet Take 2,000 Units by mouth daily.    [provider]  conjugated estrogens (PREMARIN) vaginal cream Place 1 Applicatorful vaginally 2 (two) times a week.    [provider]  Continuous Blood Gluc Sensor (DEXCOM G6 SENSOR) MISC See admin instructions. 02/20/21   [provider]  Continuous Blood Gluc Transmit (DEXCOM G6 TRANSMITTER) MISC See admin instructions. 02/20/21   [provider]   ELIQUIS 2.5 MG TABS tablet Take 2.5 mg by mouth 2 (two) times daily. 11/03/21   [provider]  EPINEPHrine 0.3 mg/0.3 mL IJ SOAJ injection Inject 0.3 mg into the muscle as needed for anaphylaxis. 08/27/20   Alexandria Lodge, MD  fluticasone (FLONASE) 50 MCG/ACT nasal spray Place 2 sprays into both nostrils daily as needed for allergies or rhinitis.     [provider]  ipratropium (ATROVENT) 0.06 % nasal spray Place 2 sprays into both nostrils 4 (four) times daily. As needed for nasal congestion, runny nose 11/17/21   Lynden Oxford Scales, PA-C  linagliptin (TRADJENTA) 5 MG TABS tablet Take 5 mg by mouth daily.    [provider]  metoprolol succinate (TOPROL-XL) 25 MG 24 hr tablet Take 25 mg by mouth daily.    [provider]  NIFEdipine (PROCARDIA XL/NIFEDICAL-XL) 90 MG 24 hr tablet Take 90 mg by mouth daily.    [provider]  omeprazole (PRILOSEC) 40 MG capsule Take 40 mg by mouth daily.    [provider]  pioglitazone (ACTOS) 15 MG tablet Take 15 mg by mouth daily.    [provider]  potassium chloride SA (KLOR-CON M) 20 MEQ tablet Take 1 tablet (20 mEq total) by mouth daily for 7 days. 02/28/22 03/07/22  Tonye Pearson, PA-C  rosuvastatin (CRESTOR) 40 MG tablet Take 40 mg by mouth daily.    [provider]      Allergies  Amoxicillin-pot clavulanate, Ciprofloxacin, Clotrimazole, Nystatin, Olmesartan, Hydrochlorothiazide, Metformin, Spironolactone, Telithromycin, and Ace inhibitors    Review of Systems   Review of Systems  Respiratory:  Negative for shortness of breath.   Cardiovascular:  Negative for chest pain and leg swelling.  Gastrointestinal:  Negative for abdominal pain, nausea and vomiting.  Skin:  Positive for wound (Hematoma).  Neurological:  Negative for syncope and headaches.  All other systems reviewed and are negative.   Physical Exam Updated Vital Signs BP (!) 169/80   Pulse 76   Temp 98 F  (36.7 C) (Oral)   Resp 18   Ht 5\' 4"  (1.626 m)   Wt 64.9 kg   SpO2 97%   BMI 24.55 kg/m  Physical Exam Vitals and nursing note reviewed.  Constitutional:      General: She is not in acute distress.    Appearance: Normal appearance. She is not ill-appearing.  HENT:     Head: Normocephalic and atraumatic.     Nose: Nose normal.  Eyes:     General: No scleral icterus.    Extraocular Movements: Extraocular movements intact.     Conjunctiva/sclera: Conjunctivae normal.  Cardiovascular:     Rate and Rhythm: Normal rate and regular rhythm.     Pulses: Normal pulses.  Pulmonary:     Effort: Pulmonary effort is normal. No respiratory distress.     Breath sounds: Normal breath sounds. No wheezing or rales.  Abdominal:     General: There is no distension.     Palpations: Abdomen is soft.     Tenderness: There is no abdominal tenderness. There is no guarding.  Musculoskeletal:        General: Normal range of motion.     Cervical back: Normal range of motion.     Right lower leg: No edema.     Left lower leg: No edema.  Skin:    General: Skin is warm and dry.  Neurological:     General: No focal deficit present.     Mental Status: She is alert and oriented to person, place, and time. Mental status is at baseline.     Comments: Cranial nerves III through XII intact.  Full range of motion of bilateral upper and lower extremities with 5/5 strength.  Sensation intact and symmetrical.  Without pronator drift.  Tongue midline.  Without facial droop.     ED Results / Procedures / Treatments   Labs (all labs ordered are listed, but only abnormal results are displayed) Labs Reviewed - No data to display  EKG None  Radiology CT Head Wo Contrast  Result Date: 10/04/2022 CLINICAL DATA:  Trauma EXAM: CT HEAD WITHOUT CONTRAST TECHNIQUE: Contiguous axial images were obtained from the base of the skull through the vertex without intravenous contrast. RADIATION DOSE REDUCTION: This exam was  performed according to the departmental dose-optimization program which includes automated exposure control, adjustment of the mA and/or kV according to patient size and/or use of iterative reconstruction technique. COMPARISON:  04/17/21 CT Brain FINDINGS: Brain: No evidence of acute infarction, hemorrhage, hydrocephalus, extra-axial collection or mass lesion/mass effect. Vascular: No hyperdense vessel or unexpected calcification. Skull: Left parietal scalp hematoma. No evidence of underlying calvarial fracture. Sinuses/Orbits: Bilateral lens replacement. Paranasal sinuses are clear. Other: None. IMPRESSION: Left parietal scalp soft tissue hematoma without evidence of underlying calvarial fracture or intracranial injury. Electronically Signed   By: Marin Roberts M.D.   On: 10/04/2022 16:30    Procedures Procedures    Medications Ordered in  ED Medications - No data to display  ED Course/ Medical Decision Making/ A&P                           Medical Decision Making Amount and/or Complexity of Data Reviewed Radiology: ordered.   Medical Decision Making / ED Course   This patient presents to the ED for concern of head injury, this involves an extensive number of treatment options, and is a complaint that carries with it a high risk of complications and morbidity.  The differential diagnosis includes intracranial bleed, fracture, scalp hematoma, concussion  MDM: 81 year old female presents with above-mentioned complaint.  Overall well-appearing.  Does have a scalp hematoma on the left parietal region.  CT without evidence of acute intracranial bleed or fracture.  BP on reevaluation improved to 169/80.  No concerning symptoms raising suspicion for hypertensive emergency.  Patient is appropriate for discharge.  Discharged in stable condition.  Neurological exam without focal deficits.  Discussed that she can continue taking her Eliquis as prescribed.   Lab Tests: -I ordered, reviewed, and  interpreted labs.   The pertinent results include:   Labs Reviewed - No data to display    EKG  EKG Interpretation  Date/Time:    Ventricular Rate:    PR Interval:    QRS Duration:   QT Interval:    QTC Calculation:   R Axis:     Text Interpretation:           Imaging Studies ordered: I ordered imaging studies including CT head without contrast I independently visualized and interpreted imaging. I agree with the radiologist interpretation   Medicines ordered and prescription drug management: No orders of the defined types were placed in this encounter.   -I have reviewed the patients home medicines and have made adjustments as needed  Reevaluation: After the interventions noted above, I reevaluated the patient and found that they have :stayed the same  Co morbidities that complicate the patient evaluation  Past Medical History:  Diagnosis Date   A-fib ([place])    Angioedema    Bradycardia    CKD (chronic kidney disease)    Diabetes mellitus without complication (HCC)    High cholesterol    Hypertension    Pain in both feet    Pain in both lower legs    Rhinorrhea    Vitreous floaters       Dispostion: Patient is appropriate for discharge.  Discharged in stable condition.  Return precaution discussed.  Final Clinical Impression(s) / ED Diagnoses Final diagnoses:  Injury of head, initial encounter  Hematoma of scalp, initial encounter    Rx / DC Orders ED Discharge Orders     None         Evlyn Courier, PA-C 10/04/22 Warren, [place], DO 10/04/22 1803

## 2022-10-04 NOTE — Discharge Instructions (Addendum)
The CT scan of the head was without any fracture or internal bleeding.  You do have a hematoma or collection of blood on the left side of your head.  This will resolve with time however in the meantime I do recommend that you ice the area for about 10 to 15 minutes every 4 hours, and take Tylenol for pain.  If you have any worsening symptoms, such as change in your vision, severe headache, weakness please return for evaluation.  You did have an elevated blood pressure reading initially of 216/68 however when we repeated this it was 169/80.  He did not have any chest pain, shortness of breath, or vision change.  If you do develop the symptoms please return for evaluation.  Continue taking your home blood pressure medications.  Otherwise follow-up with your primary care provider.  Continue taking your Eliquis as you are prescribed.

## 2022-10-04 NOTE — ED Notes (Signed)
D/c paperwork reviewed with pt, including f/u care. No questions or concerns at time of d/c. Ambulatory to ED exit without assistance, NAD>

## 2023-02-17 ENCOUNTER — Other Ambulatory Visit: Payer: Self-pay

## 2023-02-17 ENCOUNTER — Ambulatory Visit: Payer: Medicare HMO | Admitting: Internal Medicine

## 2023-02-17 ENCOUNTER — Encounter: Payer: Self-pay | Admitting: Internal Medicine

## 2023-02-17 VITALS — BP 128/66 | HR 84 | Temp 98.3°F | Resp 18 | Wt 158.0 lb

## 2023-02-17 DIAGNOSIS — T783XXA Angioneurotic edema, initial encounter: Secondary | ICD-10-CM

## 2023-02-17 DIAGNOSIS — T783XXD Angioneurotic edema, subsequent encounter: Secondary | ICD-10-CM

## 2023-02-17 NOTE — Progress Notes (Signed)
Follow Up Note  RE: Angie Barrett MRN: [id number] DOB: [birth year] Date of Office Visit: 02/17/2023  Referring provider: Lilian Coma., MD Primary care provider: Lilian Coma., MD  Chief Complaint: Follow-up (Top lip swelling ? Blister at 3 am this morning)  History of Present Illness: I had the pleasure of seeing Angie Barrett for a follow up visit at the [facility] [place] [facility] [place] on 02/17/2023. She is a 82 y.o. female, who is being followed for angioedema. Her previous allergy office visit was on 12/05/2021 with Dr. Simona Huh. Today is a  acute visit for angioedema .  History obtained from patient, chart review.  At last visit workup for HIV and acquired angioedema was negative.  Dust mite specific IgE was positive but having else is negative environmental panel.  Food testing was negative to mammalian meat, orange, citrus.  Recommend starting Zyrtec 10 mg daily which she did not do.  Today she reports waking up at 3 AM with feeling of lip fullness on her left upper lip.  Took 2 Benadryl and 1 dose of prednisone.  Symptoms have stabilized but not decreased.  She has not started her Zyrtec because she was confused about indications.  She thought the Zyrtec was for her dust mite allergy and denies any rhinitis symptoms.  Denies any urticaria.  Denies any pruritus.  Denies any sensation of throat tightness or laryngeal edema.  Assessment and Plan: Lahari is a 82 y.o. female with: Angioedema, subsequent encounter   Plan: Patient Instructions  Angioedema (tissue swelling): with acute flare  Swelling is due to your own immune system spontaneously releasing histamine For acute swellin Depo-Medrol 40 mg IM given today Start Zyrtec 10 mg daily (samples given) If no response in 3 days increase Zyrtec to 10 mg twice daily Can purchase Zyrtec (cetirizine) more cheaply at [facility], generic is okay  Follow up: 2 months    Thank you so much for letting me partake in your care today.  Don't  hesitate to reach out if you have any additional concerns!  Roney Marion, MD  [facility] [place], [place]     No orders of the defined types were placed in this encounter.   Lab Orders  No laboratory test(s) ordered today   Diagnostics: None done  Medication List:  Current Outpatient Medications  Medication Sig Dispense Refill   acetaminophen (TYLENOL) 500 MG tablet Take 2 tablets (1,000 mg total) by mouth every 6 (six) hours as needed. 30 tablet 0   aspirin EC 81 MG tablet Take 81 mg by mouth daily. Swallow whole.     calcium carbonate (OSCAL) 1500 (600 Ca) MG TABS tablet Take 600 mg of elemental calcium by mouth 2 (two) times daily with a meal.     cholecalciferol (VITAMIN D3) 25 MCG (1000 UNIT) tablet Take 2,000 Units by mouth daily.     conjugated estrogens (PREMARIN) vaginal cream Place 1 Applicatorful vaginally 2 (two) times a week.     Continuous Blood Gluc Sensor (DEXCOM G6 SENSOR) MISC See admin instructions.     Continuous Blood Gluc Transmit (DEXCOM G6 TRANSMITTER) MISC See admin instructions.     ELIQUIS 2.5 MG TABS tablet Take 2.5 mg by mouth 2 (two) times daily.     EPINEPHrine 0.3 mg/0.3 mL IJ SOAJ injection Inject 0.3 mg into the muscle as needed for anaphylaxis. 2 each 1   fluticasone (FLONASE) 50 MCG/ACT nasal spray Place 2 sprays into both nostrils daily as needed for  allergies or rhinitis.      ipratropium (ATROVENT) 0.06 % nasal spray Place 2 sprays into both nostrils 4 (four) times daily. As needed for nasal congestion, runny nose 15 mL 0   linagliptin (TRADJENTA) 5 MG TABS tablet Take 5 mg by mouth daily.     metoprolol succinate (TOPROL-XL) 25 MG 24 hr tablet Take 25 mg by mouth daily.     NIFEdipine (PROCARDIA XL/NIFEDICAL-XL) 90 MG 24 hr tablet Take 90 mg by mouth daily.     omeprazole (PRILOSEC) 40 MG capsule Take 40 mg by mouth daily.     pioglitazone (ACTOS) 15 MG tablet Take 15 mg by mouth daily.     rosuvastatin (CRESTOR) 40 MG  tablet Take 40 mg by mouth daily.     potassium chloride SA (KLOR-CON M) 20 MEQ tablet Take 1 tablet (20 mEq total) by mouth daily for 7 days. 7 tablet 0   No current facility-administered medications for this visit.   Allergies: Allergies  Allergen Reactions   Amoxicillin-Pot Clavulanate Swelling   Ciprofloxacin Swelling   Clotrimazole Swelling    Could not breathe. Could not breathe.    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  ROS: All others negative except as noted per HPI.   Objective: BP 128/66 (BP Location: Left Arm, Patient Position: Sitting, Cuff Size: Normal)   Pulse 84   Temp 98.3 F (36.8 C) (Temporal)   Resp 18   Wt 158 lb (71.7 kg)   SpO2 97%   BMI 27.12 kg/m  Body mass index is 27.12 kg/m. General Appearance:  Alert, cooperative, no distress, appears stated age  Head:  Normocephalic, without obvious abnormality, atraumatic  Eyes:  Conjunctiva clear, EOM's intact  Nose: Nares normal,   Throat: Lips, tongue normal; teeth and gums normal,   Neck: Supple, symmetrical  Lungs:   , Respirations unlabored, no coughing  Heart:  , Appears well perfused  Extremities: No edema  Skin: Skin color, texture, turgor normal, "angioedema of left lip and left cheek  Neurologic: No gross deficits   Previous notes and tests were reviewed. The plan was reviewed with the patient/family, and all questions/concerned were addressed.  It was my  pleasure to see Aquita today and participate in her care. Please feel free to contact me with any questions or concerns.  Sincerely,  Roney Marion, MD  Allergy & Immunology  [facility] [place] [facility] Office: [phone]

## 2023-02-17 NOTE — Patient Instructions (Addendum)
Angioedema (tissue swelling): with acute flare  Swelling is due to your own immune system spontaneously releasing histamine For acute swellin Depo-Medrol 40 mg IM given today Start Zyrtec 10 mg daily (samples given) If no response in 3 days increase Zyrtec to 10 mg twice daily Can purchase Zyrtec (cetirizine) more cheaply at [facility], generic is okay  Follow up: 2 months    Thank you so much for letting me partake in your care today.  Don't hesitate to reach out if you have any additional concerns!  Roney Marion, MD  [facility] [place], [place]

## 2023-02-18 DIAGNOSIS — T783XXA Angioneurotic edema, initial encounter: Secondary | ICD-10-CM

## 2023-02-18 MED ORDER — METHYLPREDNISOLONE ACETATE 40 MG/ML IJ SUSP
40.0000 mg | Freq: Once | INTRAMUSCULAR | Status: AC
  Administered 2023-02-18: 40 mg via INTRAMUSCULAR

## 2023-02-18 NOTE — Addendum Note (Signed)
Addended by: Felipa Emory on: 02/18/2023 04:36 PM   Modules accepted: Orders

## 2023-04-20 ENCOUNTER — Ambulatory Visit: Payer: Medicare HMO | Admitting: Internal Medicine

## 2023-04-20 ENCOUNTER — Encounter: Payer: Self-pay | Admitting: Internal Medicine

## 2023-04-20 VITALS — BP 118/56 | HR 90 | Temp 98.0°F | Resp 17 | Wt 156.0 lb

## 2023-04-20 DIAGNOSIS — T783XXD Angioneurotic edema, subsequent encounter: Secondary | ICD-10-CM | POA: Diagnosis not present

## 2023-04-20 DIAGNOSIS — J3089 Other allergic rhinitis: Secondary | ICD-10-CM | POA: Diagnosis not present

## 2023-04-20 MED ORDER — FLUTICASONE PROPIONATE 50 MCG/ACT NA SUSP: 1.0000 | Freq: Every day | NASAL | 2 refills | Status: AC

## 2023-04-20 NOTE — Progress Notes (Signed)
Follow Up Note  RE: Angie Barrett MRN: [id number] DOB: 1941/01/19 Date of Office Visit: 04/20/2023  Referring provider: Malka So., MD Primary care provider: Malka So., MD  Chief Complaint: Follow-up and Angioedema  History of Present Illness: I had the pleasure of seeing [name] for a follow up visit at the [facility] [place] on 04/20/2023. She is a 82 y.o. female, who is being followed for angioedema. Her previous allergy office visit was on 12/05/2021 with Dr. Maurine Minister. Today is a  acute visit for angioedema .  History obtained from patient, chart review.  At last visit workup for HIV and acquired angioedema was negative.  Dust mite specific IgE was positive but having else is negative environmental panel.  Food testing was negative to mammalian meat, orange, citrus.  Recommend starting Zyrtec 10 mg daily which she did not do.  Today she reports:  No episodes of swelling since starting the cetirizine 10 mg daily. Denies any adverse effects. Also had a prickly feeling in her skin which resolved since starting the Zyrtec.  She does continue to have nasal stuffiness despite taking the Zyrtec.  She not on any nasal sprays.  She has tried the nasal saline rinses without good response.  She has not tried dust mite precautions    Angioedema labs: Negative/ Normal C1q, C1 esterase inhibitor, function, C4, HIV, Alpha gal panel, orange, citrus.   sIgE positive to dust mite only.     Assessment and Plan: [name] is a 82 y.o. female with: Angioedema, subsequent encounter  Other allergic rhinitis   Plan: Patient Instructions  Angioedema (tissue swelling):  Swelling is due to your own immune system spontaneously releasing histamine Well controlled  Continue  Zyrtec 10 mg daily (samples given) Can purchase Zyrtec (cetirizine) more cheaply at [facility], generic is okay  2. Chronic Rhinitis  Continue Dust mite precautions as below.  Start Flonase 1 spray per  nostril 1-2 times a day. Aim  upward and outward   Follow up:  6 months   Thank you so much for letting me partake in your care today.  Don't hesitate to reach out if you have any additional concerns!  Ferol Luz, MD  [facility] [place], [place]   DUST MITE AVOIDANCE MEASURES:  There are three main measures that need and can be taken to avoid house dust mites:  Reduce accumulation of dust in general -reduce furniture, clothing, carpeting, books, stuffed animals, especially in bedroom  Separate yourself from the dust -use pillow and mattress encasements (can be found at stores such as [employer] or online) -avoid direct exposure to air condition flow -use a HEPA filter device, especially in the bedroom; you can also use a HEPA filter vacuum cleaner -wipe dust with a moist towel instead of a dry towel or broom when cleaning  Decrease mites and/or their secretions -wash clothing and linen and stuffed animals at highest temperature possible, at least every 2 weeks -stuffed animals can also be placed in a bag and put in a freezer overnight  Despite the above measures, it is impossible to eliminate dust mites or their allergen completely from your home.  With the above measures the burden of mites in your home can be diminished, with the goal of minimizing your allergic symptoms.  Success will be reached only when implementing and using all means together.    Meds ordered this encounter  Medications   fluticasone (FLONASE) 50 MCG/ACT nasal  spray    Sig: Place 1 spray into both nostrils daily.    Dispense:  16 g    Refill:  2    Lab Orders  No laboratory test(s) ordered today   Diagnostics: None done  Medication List:  Current Outpatient Medications  Medication Sig Dispense Refill   acetaminophen (TYLENOL) 500 MG tablet Take 2 tablets (1,000 mg total) by mouth every 6 (six) hours as needed. 30 tablet 0   aspirin EC 81 MG tablet Take 81 mg by  mouth daily. Swallow whole.     calcitRIOL (ROCALTROL) 0.5 MCG capsule Take 0.5 mcg by mouth daily.     calcium carbonate (OSCAL) 1500 (600 Ca) MG TABS tablet Take 600 mg of elemental calcium by mouth 2 (two) times daily with a meal.     carvedilol (COREG) 6.25 MG tablet Take by mouth.     cholecalciferol (VITAMIN D3) 25 MCG (1000 UNIT) tablet Take 2,000 Units by mouth daily.     conjugated estrogens (PREMARIN) vaginal cream Place 1 Applicatorful vaginally 2 (two) times a week.     Continuous Blood Gluc Sensor (DEXCOM G6 SENSOR) MISC See admin instructions.     Continuous Blood Gluc Transmit (DEXCOM G6 TRANSMITTER) MISC See admin instructions.     ELIQUIS 2.5 MG TABS tablet Take 2.5 mg by mouth 2 (two) times daily.     EPINEPHrine 0.3 mg/0.3 mL IJ SOAJ injection Inject 0.3 mg into the muscle as needed for anaphylaxis. 2 each 1   felodipine (PLENDIL) 10 MG 24 hr tablet Take by mouth.     fluticasone (FLONASE) 50 MCG/ACT nasal spray Place 1 spray into both nostrils daily. 16 g 2   ipratropium (ATROVENT) 0.06 % nasal spray Place 2 sprays into both nostrils 4 (four) times daily. As needed for nasal congestion, runny nose 15 mL 0   linagliptin (TRADJENTA) 5 MG TABS tablet Take 5 mg by mouth daily.     metoprolol succinate (TOPROL-XL) 25 MG 24 hr tablet Take 25 mg by mouth daily.     NIFEdipine (PROCARDIA XL/NIFEDICAL-XL) 90 MG 24 hr tablet Take 90 mg by mouth daily.     omeprazole (PRILOSEC) 40 MG capsule Take 40 mg by mouth daily.     pioglitazone (ACTOS) 15 MG tablet Take 15 mg by mouth daily.     rosuvastatin (CRESTOR) 40 MG tablet Take 40 mg by mouth daily.     potassium chloride SA (KLOR-CON M) 20 MEQ tablet Take 1 tablet (20 mEq total) by mouth daily for 7 days. 7 tablet 0   No current facility-administered medications for this visit.   Allergies: Allergies  Allergen Reactions   Amoxicillin-Pot Clavulanate Swelling   Ciprofloxacin Swelling   Clotrimazole Swelling    Could not  breathe. Could not breathe.    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  ROS: All others negative except as noted per HPI.   Objective: BP (!) 118/56   Pulse 90  Temp 98 F (36.7 C) (Temporal)   Resp 17   Wt 156 lb (70.8 kg)   SpO2 96%   BMI 26.78 kg/m  Body mass index is 26.78 kg/m. General Appearance:  Alert, cooperative, no distress, appears stated age  Head:  Normocephalic, without obvious abnormality, atraumatic  Eyes:  Conjunctiva clear, EOM's intact  Nose: Nares normal, hypertrophic inferior turbinates, erythematous nasal mucosa, no rhinorrhea  Throat: Lips, tongue normal; teeth and gums normal,   Neck: Supple, symmetrical  Lungs:   , Respirations unlabored, no coughing, clear to auscultation bilaterally  Heart:  , Appears well perfused  Extremities: No edema  Skin: Skin color, texture, turgor normal, no rashes or lesions on visualized portions of skin  Neurologic: No gross deficits   Previous notes and tests were reviewed. The plan was reviewed with the patient/family, and all questions/concerned were addressed.  It was my pleasure to see Angie Barrett today and participate in her care. Please feel free to contact me with any questions or concerns.  Sincerely,  Ferol Luz, MD  Allergy & Immunology  [facility] Office: [phone]

## 2023-04-20 NOTE — Patient Instructions (Addendum)
Angioedema (tissue swelling):  Swelling is due to your own immune system spontaneously releasing histamine Well controlled  Continue  Zyrtec 10 mg daily (samples given) Can purchase Zyrtec (cetirizine) more cheaply at [facility], generic is okay  2. Chronic Rhinitis  Continue Dust mite precautions as below.  Start Flonase 1 spray per nostril 1-2 times a day. Aim  upward and outward   Follow up:  6 months   Thank you so much for letting me partake in your care today.  Don't hesitate to reach out if you have any additional concerns!  Ferol Luz, MD  [facility] [place], [place]   DUST MITE AVOIDANCE MEASURES:  There are three main measures that need and can be taken to avoid house dust mites:  Reduce accumulation of dust in general -reduce furniture, clothing, carpeting, books, stuffed animals, especially in bedroom  Separate yourself from the dust -use pillow and mattress encasements (can be found at stores such as [employer] or online) -avoid direct exposure to air condition flow -use a HEPA filter device, especially in the bedroom; you can also use a HEPA filter vacuum cleaner -wipe dust with a moist towel instead of a dry towel or broom when cleaning  Decrease mites and/or their secretions -wash clothing and linen and stuffed animals at highest temperature possible, at least every 2 weeks -stuffed animals can also be placed in a bag and put in a freezer overnight  Despite the above measures, it is impossible to eliminate dust mites or their allergen completely from your home.  With the above measures the burden of mites in your home can be diminished, with the goal of minimizing your allergic symptoms.  Success will be reached only when implementing and using all means together.

## 2023-09-02 ENCOUNTER — Encounter: Payer: Self-pay | Admitting: Internal Medicine

## 2023-09-02 ENCOUNTER — Ambulatory Visit: Payer: Medicare HMO | Admitting: Internal Medicine

## 2023-09-02 VITALS — BP 140/90 | HR 72 | Temp 97.7°F | Resp 12 | Ht 62.4 in | Wt 160.7 lb

## 2023-09-02 DIAGNOSIS — J3089 Other allergic rhinitis: Secondary | ICD-10-CM | POA: Diagnosis not present

## 2023-09-02 DIAGNOSIS — B9789 Other viral agents as the cause of diseases classified elsewhere: Secondary | ICD-10-CM | POA: Diagnosis not present

## 2023-09-02 DIAGNOSIS — J988 Other specified respiratory disorders: Secondary | ICD-10-CM | POA: Diagnosis not present

## 2023-09-02 DIAGNOSIS — T783XXD Angioneurotic edema, subsequent encounter: Secondary | ICD-10-CM | POA: Diagnosis not present

## 2023-09-02 NOTE — Patient Instructions (Signed)
Angioedema (tissue swelling):  Swelling is due to your own immune system spontaneously releasing histamine Well controlled  Continue  Zyrtec 10 mg daily (samples given) Can purchase Zyrtec (cetirizine) more cheaply at [facility], generic is okay  2. Chronic Rhinitis  Continue Dust mite precautions as below.  Start Flonase 1 spray per nostril 1-2 times a day. Aim  upward and outward   Follow up:  6 months   Thank you so much for letting me partake in your care today.  Don't hesitate to reach out if you have any additional concerns!  Ferol Luz, MD  [facility] [place], [place]   DUST MITE AVOIDANCE MEASURES:  There are three main measures that need and can be taken to avoid house dust mites:  Reduce accumulation of dust in general -reduce furniture, clothing, carpeting, books, stuffed animals, especially in bedroom  Separate yourself from the dust -use pillow and mattress encasements (can be found at stores such as [employer] or online) -avoid direct exposure to air condition flow -use a HEPA filter device, especially in the bedroom; you can also use a HEPA filter vacuum cleaner -wipe dust with a moist towel instead of a dry towel or broom when cleaning  Decrease mites and/or their secretions -wash clothing and linen and stuffed animals at highest temperature possible, at least every 2 weeks -stuffed animals can also be placed in a bag and put in a freezer overnight  Despite the above measures, it is impossible to eliminate dust mites or their allergen completely from your home.  With the above measures the burden of mites in your home can be diminished, with the goal of minimizing your allergic symptoms.  Success will be reached only when implementing and using all means together.

## 2023-09-02 NOTE — Progress Notes (Signed)
FOLLOW UP Date of Service/Encounter:  09/02/23  Subjective:  Angie Pigg (DOB: 20-Jul-1941) is a 82 y.o. female who [facility] on 09[facility]02/2023 in re-evaluation of the following: Acute visit for runny nose History obtained from: chart review and patient.  For Review, LV was on  06/04/24with Dr. Marlynn Perking seen for routine follow-up.  For angioedema, histamine mediated and perennial allergic rhinitis See below for summary of history and diagnostics.   Therapeutic plans/changes recommended: Continued on Zyrtec 10 mg daily and advised to use Flonase as needed for nasal symptoms. ----------------------------------------------------- Pertinent History/Diagnostics:  Angioedema:  HIV and acquired angioedema was negative . Controlled on zyrtec 10 mg daily. Dust mite specific IgE was positive but rest negative environmental panel. Food testing was negative to mammalian meat, orange, citrus.  --------------------------------------------------- Today presents for follow-up. Discussed the use of AI scribe software for clinical note transcription with the patient, who gave verbal consent to proceed.  History of Present Illness   The patient presents with recent onset of nasal symptoms, including itchiness, watery discharge, and sneezing. She reports that these symptoms have been occurring for a few days and are not constant, with more pronounced symptoms in the morning. The patient has been managing these symptoms with Zyrtec, but has not been using any nasal sprays due to a previous episode of nosebleed that required cauterization six months ago.  In addition to the nasal symptoms, the patient also reports a recent lack of energy. She denies any fever or other systemic symptoms. The patient has a known history of dust mite allergies and has implemented measures such as dust mite covers on her mattress and pillows, and washing her bedding in hot water twice a week. However, she  admits to not vacuuming her carpeted bedroom twice a week as recommended.  The patient's nasal symptoms are a new development, as she has not experienced this type of issue before. Prior to this, her allergy symptoms were well-controlled with Zyrtec.   She has not had further angioedema episodes since starting Zyrtec daily.  She is interested in COVID-19 rapid testing.      All medications reviewed by clinical staff and updated in chart. No new pertinent medical or surgical history except as noted in HPI.  ROS: All others negative except as noted per HPI.   Objective:  BP (!) 140/90 (BP Location: Right Arm, Patient Position: Sitting, Cuff Size: Normal)   Pulse 72   Temp 97.7 F (36.5 C) (Temporal)   Resp 12   Ht 5' 2.4" (1.585 m)   Wt 160 lb 11.2 oz (72.9 kg)   SpO2 99%   BMI 29.01 kg/m  Body mass index is 29.01 kg/m. Physical Exam: General Appearance:  Alert, cooperative, no distress, appears stated age  Head:  Normocephalic, without obvious abnormality, atraumatic  Eyes:  Conjunctiva clear, EOM's intact  Ears EACs normal bilaterally and normal TMs bilaterally  Nose: Nares normal, hypertrophic turbinates, normal mucosa, no visible anterior polyps, and septum midline  Throat: Lips, tongue normal; teeth and gums normal, normal posterior oropharynx  Neck: Supple, symmetrical  Lungs:   clear to auscultation bilaterally, Respirations unlabored, no coughing  Heart:  regular rate and rhythm and no murmur, Appears well perfused  Extremities: No edema  Skin: Skin color, texture, turgor normal and no rashes or lesions on visualized portions of skin  Neurologic: No gross deficits   Labs:  Lab Orders  No laboratory test(s) ordered today   Rapid COVID testing negative  Assessment/Plan   Angioedema-histamine mediated Continue Zyrtec 10 mg daily  Chronic rhinitis-suspect currently with viral illness.  Has history of perennial allergic rhinitis. Recent onset of itchy, watery  nose, and sneezing. Currently on Zyrtec. History of nosebleed with Flonase use. Dust mite allergy confirmed. -Continue Zyrtec. -Encouraged vacuuming carpets twice a week and washing bedding in hot water weekly. -Rapid COVID-19 testing negative.  Possible Viral Infection Recent onset of symptoms and lack of energy. No fever reported. Concern for COVID-19 due to current pandemic. -Perform rapid COVID-19 test.  Negative. -Continue supportive care with Zyrtec for symptom management. -Can add nasal saline spray as needed  Other: none  Tonny Bollman, MD  [facility] [place]

## 2023-09-29 ENCOUNTER — Ambulatory Visit: Payer: Medicare HMO | Admitting: Internal Medicine

## 2023-09-29 ENCOUNTER — Encounter: Payer: Self-pay | Admitting: Internal Medicine

## 2023-09-29 VITALS — BP 138/62 | HR 88 | Temp 97.5°F | Resp 17 | Wt 164.9 lb

## 2023-09-29 DIAGNOSIS — H1045 Other chronic allergic conjunctivitis: Secondary | ICD-10-CM | POA: Diagnosis not present

## 2023-09-29 DIAGNOSIS — T783XXD Angioneurotic edema, subsequent encounter: Secondary | ICD-10-CM

## 2023-09-29 DIAGNOSIS — J3089 Other allergic rhinitis: Secondary | ICD-10-CM | POA: Diagnosis not present

## 2023-09-29 MED ORDER — OLOPATADINE HCL 0.2 % OP SOLN: 1.0000 [drp] | Freq: Every day | OPHTHALMIC | 3 refills | Status: AC

## 2023-09-29 MED ORDER — MONTELUKAST SODIUM 10 MG PO TABS: 10.0000 mg | ORAL_TABLET | Freq: Every day | ORAL | 5 refills | Status: DC

## 2023-09-29 MED ORDER — LEVOCETIRIZINE DIHYDROCHLORIDE 5 MG PO TABS: 5.0000 mg | ORAL_TABLET | Freq: Every evening | ORAL | 5 refills | Status: DC

## 2023-09-29 MED ORDER — IPRATROPIUM BROMIDE 0.06 % NA SOLN: 2.0000 | Freq: Four times a day (QID) | NASAL | 5 refills | Status: DC

## 2023-09-29 NOTE — Patient Instructions (Addendum)
Angioedema (tissue swelling): histaminergic  Well controlled  Continue  Zyzal 5mg  daily   2. Chronic Rhinitis  Continue Dust mite precautions as below.  Start -Atrovent (ipatopium) nasal spray 1-2 sprays in each nostril up to three times daily AS NEEDED for POST NASAL DRIP/RUNNY NOSE/DRAINAGE.  If you become too dry, use less often. Montelukast 10mg  at night   Follow up:  6 months   Thank you so much for letting me partake in your care today.  Don't hesitate to reach out if you have any additional concerns!  Ferol Luz, MD  [facility] [place], [place]  DUST MITE AVOIDANCE MEASURES:  There are three main measures that need and can be taken to avoid house dust mites:  Reduce accumulation of dust in general -reduce furniture, clothing, carpeting, books, stuffed animals, especially in bedroom  Separate yourself from the dust -use pillow and mattress encasements (can be found at stores such as [employer] or online) -avoid direct exposure to air condition flow -use a HEPA filter device, especially in the bedroom; you can also use a HEPA filter vacuum cleaner -wipe dust with a moist towel instead of a dry towel or broom when cleaning  Decrease mites and/or their secretions -wash clothing and linen and stuffed animals at highest temperature possible, at least every 2 weeks -stuffed animals can also be placed in a bag and put in a freezer overnight  Despite the above measures, it is impossible to eliminate dust mites or their allergen completely from your home.  With the above measures the burden of mites in your home can be diminished, with the goal of minimizing your allergic symptoms.  Success will be reached only when implementing and using all means together.

## 2023-09-29 NOTE — Progress Notes (Signed)
FOLLOW UP Date of Service/Encounter:  09/29/23  Subjective:  Angie Barrett (DOB: 05-01-1941) is a 82 y.o. female who [facility] in re-evaluation of the following: Acute visit for runny nose History obtained from: chart review and patient.  For Review, LV was on  10/17/24with Dr.Dennis seen for acute visit for viral illnes  .  For angioedema, histamine mediated and perennial allergic rhinitis See below for summary of history and diagnostics.   Therapeutic plans/changes recommended: Continued on Zyrtec 10 mg daily and advised to use Flonase as needed for nasal symptoms. ----------------------------------------------------- Pertinent History/Diagnostics:  Angioedema:  HAE and acquired angioedema was negative . Controlled on zyrtec 10 mg daily. Dust mite specific IgE was positive but rest negative environmental panel. Food testing was negative to mammalian meat, orange, citrus.  --------------------------------------------------- Today presents for follow-up. Discussed the use of AI scribe software for clinical note transcription with the patient, who gave verbal consent to proceed.  The patient, with a history angioedema, allergies and recurrent nosebleeds, presents with a recent increase in nasal symptoms. She reports that she had stopped using her prescribed nasal spray (flonase)  due to nosebleeds, which were subsequently treated by an ENT specialist with cauterization. Following this procedure, the patient ceased all nasal sprays and experienced a resolution of the nosebleeds.  However, in the past two to three days, she has noticed an increase in nasal discharge and frequent sneezing, accompanied by a mild headache. She denies any fever. The patient continues to take Zyzal (levocetirizine) for swelling and reports no recent episodes of swelling since starting this medication. She also mentions that she has run out of Singulair, another medication  for her allergies. 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     All medications reviewed by clinical staff and updated in chart. No new pertinent medical or surgical history except as noted in HPI.  ROS: All others negative except as noted per HPI.   Objective:  There were no vitals taken for this visit. There is no height or weight on file to calculate BMI. Physical Exam: General Appearance:  Alert, cooperative, no distress, appears stated age  Head:  Normocephalic, without obvious abnormality, atraumatic  Eyes:  Conjunctiva clear, EOM's intact  Ears EACs normal bilaterally and normal TMs bilaterally  Nose: Nares normal,  erythematous nasal mucosa with clear rhinnorhea, hypertrophic turbinates, no visible anterior polyps, and septum midline  Throat: Lips, tongue normal; teeth and gums normal, normal posterior oropharynx  Neck: Supple, symmetrical  Lungs:   clear to auscultation bilaterally, Respirations unlabored, no coughing  Heart:  regular rate and rhythm and no murmur, Appears well perfused  Extremities: No edema  Skin: Skin color, texture, turgor normal and no rashes or lesions on visualized portions of skin  Neurologic: No gross deficits   Labs:  Lab Orders  No laboratory test(s) ordered today    Assessment/Plan   Angioedema (tissue swelling): histaminergic  Well controlled  Continue  Zyrtec 10 mg daily  2. Chronic Rhinitis  Continue Dust mite precautions as below.  Start -Atrovent (ipatopium) nasal spray 1-2 sprays in each nostril up to three times daily AS NEEDED for POST NASAL DRIP/RUNNY NOSE/DRAINAGE.  If you become too dry, use less often. Montelukast 10mg  at night   Follow up:  6 months   Thank you so much for letting me partake in your care today.  Don't hesitate to  reach out if you have any additional concerns!  Ferol Luz, MD  [facility] [place],  [place]

## 2023-10-19 ENCOUNTER — Ambulatory Visit: Payer: Medicare HMO | Admitting: Internal Medicine

## 2023-10-26 ENCOUNTER — Encounter: Payer: Self-pay | Admitting: Internal Medicine

## 2023-10-26 ENCOUNTER — Ambulatory Visit: Payer: Medicare HMO | Admitting: Internal Medicine

## 2023-10-26 VITALS — BP 148/66 | HR 77 | Temp 97.6°F

## 2023-10-26 DIAGNOSIS — J3089 Other allergic rhinitis: Secondary | ICD-10-CM

## 2023-10-26 DIAGNOSIS — T783XXD Angioneurotic edema, subsequent encounter: Secondary | ICD-10-CM

## 2023-10-26 NOTE — Patient Instructions (Addendum)
Angioedema (tissue swelling): histaminergic  Well controlled  Continue  Zyzal 5mg  daily   2. Chronic Rhinitis  Continue Dust mite precautions as below.  Reduce  -Atrovent (ipatopium) nasal spray 1 sprays in each nostril every other day to counter and drying out side effects  Montelukast 10mg  at night  Start sinus rinses daily   Follow up:  6 months   Thank you so much for letting me partake in your care today.  Don't hesitate to reach out if you have any additional concerns!  Ferol Luz, MD  [facility] [place], [place]  DUST MITE AVOIDANCE MEASURES:  There are three main measures that need and can be taken to avoid house dust mites:  Reduce accumulation of dust in general -reduce furniture, clothing, carpeting, books, stuffed animals, especially in bedroom  Separate yourself from the dust -use pillow and mattress encasements (can be found at stores such as [employer] or online) -avoid direct exposure to air condition flow -use a HEPA filter device, especially in the bedroom; you can also use a HEPA filter vacuum cleaner -wipe dust with a moist towel instead of a dry towel or broom when cleaning  Decrease mites and/or their secretions -wash clothing and linen and stuffed animals at highest temperature possible, at least every 2 weeks -stuffed animals can also be placed in a bag and put in a freezer overnight  Despite the above measures, it is impossible to eliminate dust mites or their allergen completely from your home.  With the above measures the burden of mites in your home can be diminished, with the goal of minimizing your allergic symptoms.  Success will be reached only when implementing and using all means together.

## 2023-10-26 NOTE — Progress Notes (Signed)
FOLLOW UP Date of Service/Encounter:  10/26/23  Subjective:  Angie Barrett (DOB: [birth year]) is a 82 y.o. female who [facility] in re-evaluation of the following: Acute visit for runny nose History obtained from: chart review and patient.  For Review, LV was on  11/13/24with Dr. Marlynn Perking seen for routine follow-up.  For angioedema, histamine mediated and perennial allergic rhinitis See below for summary of history and diagnostics.   Therapeutic plans/changes recommended: Continued on Xyzal mg daily and started on Atrovent for drainage.  Continued on montelukast. ----------------------------------------------------- Pertinent History/Diagnostics:  Angioedema:  HAE and acquired angioedema was negative . Controlled on zyrtec 10 mg daily. Dust mite specific IgE was positive but rest negative environmental panel. Food testing was negative to mammalian meat, orange, citrus.  --------------------------------------------------- Today presents for follow-up. Discussed the use of AI scribe software for clinical note transcription with the patient, who gave verbal consent to proceed.  The patient, with a history of chronic rhinitis and angioedema presents with complaints of nasal dryness and throat discomfort. She reports that the Atrovent nasal spray, used twice daily, has been causing these symptoms. The patient also mentions a persistent runny nose with drainage down the back of his throat and out the front, since stopping atrovent.   However, she reports a significant improvement with the use of Zyrtec, with only no recurrence recently. She denies any side effects from his current medications.  The patient also discusses her living environment, specifically his bedroom, which he believes is a significant source of dust mites.  She has not bought a dehumidifier yet, but is planning to    All medications reviewed by clinical staff and updated in  chart. No new pertinent medical or surgical history except as noted in HPI.  ROS: All others negative except as noted per HPI.   Objective:  BP (!) 148/66 (BP Location: Right Arm, Patient Position: Sitting, Cuff Size: Normal)   Pulse 77   Temp 97.6 F (36.4 C) (Temporal)   SpO2 99%  There is no height or weight on file to calculate BMI. Physical Exam: General Appearance:  Alert, cooperative, no distress, appears stated age  Head:  Normocephalic, without obvious abnormality, atraumatic  Eyes:  Conjunctiva clear, EOM's intact  Ears EACs normal bilaterally and normal TMs bilaterally  Nose: Nares normal,  erythematous rhinnorhea, normal turbinates , no visible anterior polyps, and septum midline  Throat: Lips, tongue normal; teeth and gums normal, normal posterior oropharynx  Neck: Supple, symmetrical  Lungs:   clear to auscultation bilaterally, Respirations unlabored, no coughing  Heart:  regular rate and rhythm and no murmur, Appears well perfused  Extremities: No edema  Skin: Skin color, texture, turgor normal and no rashes or lesions on visualized portions of skin  Neurologic: No gross deficits   Labs:  Lab Orders  No laboratory test(s) ordered today    Assessment/Plan   Angioedema (tissue swelling): histaminergic  Well controlled  Continue  Zyzal 5mg  daily   2. Chronic Rhinitis  Continue Dust mite precautions as below.  Reduce  -Atrovent (ipatopium) nasal spray 1 sprays in each nostril every other day to counter and drying out side effects  Montelukast 10mg  at night  Start sinus rinses daily   Follow up:  6 months    Thank you so much for letting me partake in your care today.  Don't hesitate to reach out if you have any additional concerns!  Ferol Luz, MD  [facility]  [place], [place]

## 2024-01-17 ENCOUNTER — Emergency Department (HOSPITAL_BASED_OUTPATIENT_CLINIC_OR_DEPARTMENT_OTHER)

## 2024-01-17 ENCOUNTER — Encounter (HOSPITAL_BASED_OUTPATIENT_CLINIC_OR_DEPARTMENT_OTHER): Payer: Self-pay

## 2024-01-17 ENCOUNTER — Emergency Department (HOSPITAL_BASED_OUTPATIENT_CLINIC_OR_DEPARTMENT_OTHER): Admission: EM | Admit: 2024-01-17 | Discharge: 2024-01-17 | Disposition: A | Discharge status: Home / Self Care

## 2024-01-17 ENCOUNTER — Other Ambulatory Visit: Payer: Self-pay

## 2024-01-17 DIAGNOSIS — Z7982 Long term (current) use of aspirin: Secondary | ICD-10-CM | POA: Insufficient documentation

## 2024-01-17 DIAGNOSIS — E876 Hypokalemia: Secondary | ICD-10-CM | POA: Diagnosis not present

## 2024-01-17 DIAGNOSIS — R600 Localized edema: Secondary | ICD-10-CM | POA: Diagnosis not present

## 2024-01-17 DIAGNOSIS — R0602 Shortness of breath: Secondary | ICD-10-CM | POA: Diagnosis not present

## 2024-01-17 DIAGNOSIS — R7989 Other specified abnormal findings of blood chemistry: Secondary | ICD-10-CM

## 2024-01-17 DIAGNOSIS — R059 Cough, unspecified: Secondary | ICD-10-CM | POA: Diagnosis present

## 2024-01-17 DIAGNOSIS — Z7901 Long term (current) use of anticoagulants: Secondary | ICD-10-CM | POA: Diagnosis not present

## 2024-01-17 DIAGNOSIS — R5383 Other fatigue: Secondary | ICD-10-CM

## 2024-01-17 LAB — COMPREHENSIVE METABOLIC PANEL
ALT: 29 U/L (ref 0–44)
AST: 37 U/L (ref 15–41)
Albumin: 3.4 g/dL — ABNORMAL LOW (ref 3.5–5.0)
Alkaline Phosphatase: 27 U/L — ABNORMAL LOW (ref 38–126)
Anion gap: 11 (ref 5–15)
BUN: 22 mg/dL (ref 8–23)
CO2: 25 mmol/L (ref 22–32)
Calcium: 8.7 mg/dL — ABNORMAL LOW (ref 8.9–10.3)
Chloride: 99 mmol/L (ref 98–111)
Creatinine, Ser: 1.81 mg/dL — ABNORMAL HIGH (ref 0.44–1.00)
GFR, Estimated: 27 mL/min — ABNORMAL LOW (ref >60)
Glucose, Bld: 184 mg/dL — ABNORMAL HIGH (ref 70–99)
Potassium: 3.2 mmol/L — ABNORMAL LOW (ref 3.5–5.1)
Sodium: 135 mmol/L (ref 135–145)
Total Bilirubin: 0.5 mg/dL (ref 0.0–1.2)
Total Protein: 6.8 g/dL (ref 6.5–8.1)

## 2024-01-17 LAB — BRAIN NATRIURETIC PEPTIDE: B Natriuretic Peptide: 1327.2 pg/mL — ABNORMAL HIGH (ref 0.0–100.0)

## 2024-01-17 LAB — TROPONIN I (HIGH SENSITIVITY)
Troponin I (High Sensitivity): 17 ng/L (ref <18)
Troponin I (High Sensitivity): 17 ng/L (ref <18)

## 2024-01-17 LAB — RESP PANEL BY RT-PCR (RSV, FLU A&B, COVID)  RVPGX2
Influenza A by PCR: NEGATIVE
Influenza B by PCR: NEGATIVE
Resp Syncytial Virus by PCR: NEGATIVE
SARS Coronavirus 2 by RT PCR: NEGATIVE

## 2024-01-17 LAB — CBC
HCT: 32.3 % — ABNORMAL LOW (ref 36.0–46.0)
Hemoglobin: 10.7 g/dL — ABNORMAL LOW (ref 12.0–15.0)
MCH: 30.4 pg (ref 26.0–34.0)
MCHC: 33.1 g/dL (ref 30.0–36.0)
MCV: 91.8 fL (ref 80.0–100.0)
Platelets: 169 10*3/uL (ref 150–400)
RBC: 3.52 MIL/uL — ABNORMAL LOW (ref 3.87–5.11)
RDW: 15.1 % (ref 11.5–15.5)
WBC: 4.4 10*3/uL (ref 4.0–10.5)
nRBC: 0 % (ref 0.0–0.2)

## 2024-01-17 MED ORDER — POTASSIUM CHLORIDE CRYS ER 20 MEQ PO TBCR
40.0000 meq | EXTENDED_RELEASE_TABLET | Freq: Once | ORAL | Status: AC
  Administered 2024-01-17: 40 meq via ORAL
  Filled 2024-01-17: qty 2

## 2024-01-17 NOTE — ED Notes (Signed)
 Patient transported to X-ray via stretcher, tol well.

## 2024-01-17 NOTE — ED Provider Notes (Signed)
 [place] [facility] Provider Note   CSN: [id number] Arrival date & time: 01/17/24  1007     History  Chief Complaint  Patient presents with   Fatigue    Angie Barrett is a 83 y.o. female.  This is a 83 year old female presenting emergency department with complaint of cough and shortness of breath.  Reports symptoms started Friday and worsened Saturday morning.  Was discharged from urgent care, was told she had pleural effusions on chest x-ray.  She reports her systems persisted with some congestion and cough with clear mucus.  She reports shortness of breath has been ongoing and slightly worsened over the past few days.  No chest pain.  She did recently change how she takes her Lasix instead of daily, taking it every other day.        Home Medications Prior to Admission medications   Medication Sig Start Date End Date Taking? Authorizing Provider  acetaminophen (TYLENOL) 500 MG tablet Take 2 tablets (1,000 mg total) by mouth every 6 (six) hours as needed. 05/22/18   Arby Barrette, MD  aspirin EC 81 MG tablet Take 81 mg by mouth daily. Swallow whole. Patient not taking: Reported on 09/02/2023    [provider]  calcitRIOL (ROCALTROL) 0.5 MCG capsule Take 0.5 mcg by mouth daily.    [provider]  calcium carbonate (OSCAL) 1500 (600 Ca) MG TABS tablet Take 600 mg of elemental calcium by mouth 2 (two) times daily with a meal.    [provider]  carvedilol (COREG) 6.25 MG tablet Take by mouth. Patient not taking: Reported on 09/29/2023 10/04/19   [provider]  cholecalciferol (VITAMIN D3) 25 MCG (1000 UNIT) tablet Take 2,000 Units by mouth daily.    [provider]  conjugated estrogens (PREMARIN) vaginal cream Place 1 Applicatorful vaginally 2 (two) times a week.    [provider]  Continuous Glucose Sensor (DEXCOM G7 SENSOR) MISC by Does not apply route.    [provider]   ELIQUIS 2.5 MG TABS tablet Take 2.5 mg by mouth 2 (two) times daily. 11/03/21   [provider]  EPINEPHrine 0.3 mg/0.3 mL IJ SOAJ injection Inject 0.3 mg into the muscle as needed for anaphylaxis. 08/27/20   Alphonzo Severance, MD  felodipine (PLENDIL) 10 MG 24 hr tablet Take by mouth. 10/07/22   [provider]  fluticasone (FLONASE) 50 MCG/ACT nasal spray Place 1 spray into both nostrils daily. Patient not taking: Reported on 10/26/2023 04/20/23   Ferol Luz, MD  ipratropium (ATROVENT) 0.06 % nasal spray Place 2 sprays into both nostrils 4 (four) times daily. As needed for nasal congestion, runny nose 09/29/23   Ferol Luz, MD  levocetirizine (XYZAL) 5 MG tablet Take 1 tablet (5 mg total) by mouth every evening. 09/29/23   Ferol Luz, MD  linagliptin (TRADJENTA) 5 MG TABS tablet Take 5 mg by mouth daily.    [provider]  metoprolol succinate (TOPROL-XL) 25 MG 24 hr tablet Take 25 mg by mouth daily.    [provider]  montelukast (SINGULAIR) 10 MG tablet Take 1 tablet (10 mg total) by mouth at bedtime. 09/29/23   Ferol Luz, MD  NIFEdipine (PROCARDIA XL/NIFEDICAL-XL) 90 MG 24 hr tablet Take 90 mg by mouth daily.    [provider]  Olopatadine HCl 0.2 % SOLN Apply 1 drop to eye daily. Patient not taking: Reported on 10/26/2023 09/29/23   Ferol Luz, MD  omeprazole (PRILOSEC) 40 MG  capsule Take 40 mg by mouth daily.    [provider]  pioglitazone (ACTOS) 15 MG tablet Take 15 mg by mouth daily.    [provider]  potassium chloride SA (KLOR-CON M) 20 MEQ tablet Take 1 tablet (20 mEq total) by mouth daily for 7 days. 02/28/22 03/07/22  Janell Quiet, PA-C  rosuvastatin (CRESTOR) 40 MG tablet Take 40 mg by mouth daily.    [provider]  torsemide (DEMADEX) 20 MG tablet Take 20 mg by mouth daily.    [provider]      Allergies    Amoxicillin-pot clavulanate, Ciprofloxacin,  Clotrimazole, Nystatin, Olmesartan, Hydrochlorothiazide, Metformin, Other, Parathyroid hormone (recomb), Spironolactone, Telithromycin, Teriparatide, Tramadol, and Ace inhibitors    Review of Systems   Review of Systems  Physical Exam Updated Vital Signs BP (!) 140/61   Pulse 69   Temp 99.4 F (37.4 C) (Oral)   Resp 13   Ht 5\' 4"  (1.626 m)   Wt 72.6 kg   SpO2 99%   BMI 27.46 kg/m  Physical Exam Vitals and nursing note reviewed.  Constitutional:      General: She is not in acute distress.    Appearance: She is not toxic-appearing.  HENT:     Head: Normocephalic.     Nose: Nose normal.     Mouth/Throat:     Mouth: Mucous membranes are moist.  Eyes:     Conjunctiva/sclera: Conjunctivae normal.  Cardiovascular:     Rate and Rhythm: Normal rate and regular rhythm.  Pulmonary:     Effort: Pulmonary effort is normal.     Breath sounds: Normal breath sounds. No wheezing, rhonchi or rales.  Abdominal:     General: Abdomen is flat. There is no distension.     Tenderness: There is no abdominal tenderness. There is no guarding or rebound.  Musculoskeletal:     Right lower leg: Edema present.     Left lower leg: Edema present.  Skin:    General: Skin is warm.     Capillary Refill: Capillary refill takes less than 2 seconds.  Neurological:     Mental Status: She is alert and oriented to person, place, and time.  Psychiatric:        Mood and Affect: Mood normal.        Behavior: Behavior normal.     ED Results / Procedures / Treatments   Labs (all labs ordered are listed, but only abnormal results are displayed) Labs Reviewed  CBC - Abnormal; Notable for the following components:      Result Value   RBC 3.52 (*)    Hemoglobin 10.7 (*)    HCT 32.3 (*)    All other components within normal limits  COMPREHENSIVE METABOLIC PANEL - Abnormal; Notable for the following components:   Potassium 3.2 (*)    Glucose, Bld 184 (*)    Creatinine, Ser 1.81 (*)    Calcium 8.7 (*)     Albumin 3.4 (*)    Alkaline Phosphatase 27 (*)    GFR, Estimated 27 (*)    All other components within normal limits  BRAIN NATRIURETIC PEPTIDE - Abnormal; Notable for the following components:   B Natriuretic Peptide 1,327.2 (*)    All other components within normal limits  RESP PANEL BY RT-PCR (RSV, FLU A&B, COVID)  RVPGX2  TROPONIN I (HIGH SENSITIVITY)  TROPONIN I (HIGH SENSITIVITY)    EKG EKG Interpretation Date/Time:  Monday January 17 2024 11:48:42 EST Ventricular Rate:  70 PR Interval:  71 QRS Duration:  177 QT Interval:  444 QTC Calculation: 480 R Axis:   -70  Text Interpretation: Sinus rhythm Short PR interval Nonspecific IVCD with LAD LVH with secondary repolarization abnormality Confirmed by Estanislado Pandy [phone]) on 01/17/2024 12:17:15 PM  Radiology DG Chest 2 View Result Date: 01/17/2024 CLINICAL DATA:  Short of breath EXAM: CHEST - 2 VIEW COMPARISON:  01/15/2024 FINDINGS: LEFT-sided pacer overlies normal cardiac silhouette. No effusion, infiltrate, or pneumothorax. No acute osseous abnormality. IMPRESSION: No acute cardiopulmonary process. Electronically Signed   By: Genevive Bi M.D.   On: 01/17/2024 12:02    Procedures Procedures    Medications Ordered in ED Medications  potassium chloride SA (KLOR-CON M) CR tablet 40 mEq (40 mEq Oral Given 01/17/24 1259)    ED Course/ Medical Decision Making/ A&P Clinical Course as of 01/17/24 1314  Mon Jan 17, 2024  1230 Echo 01/05/23: "This result has an attachment that is not available.  1.  Left Ventricle  Left ventricle size is normal. There is mild hypertrophy. EF: 55-60%.   Right Ventricle  Right ventricle size is normal. Systolic function is normal. Normal tricuspid annular plane systolic excursion (TAPSE) >1.7 cm.   Left Atrium  Left atrium size is normalLeft atrium volume index is normal (16-34 mL/m2). No patent foramen ovale visualized.   Right Atrium  Right atrium size is normal. Volume appears normal.    IVC/SVC  The inferior vena cava demonstrates a diameter of <=2.1 cm and collapses >50%; therefore, the right atrial pressure is estimated at 3 mmHg.   Mitral Valve  Mitral valve structure is normal. The leaflets are mildly thickened. There is mild annular calcification. There is mild to moderate regurgitation.   Tricuspid Valve  Tricuspid valve structure is normal. There is mild regurgitation. The right ventricular systolic pressure is moderately elevated (50-59 mmHg).   Aortic Valve  The aortic valve is tricuspid. There is mild sclerosis. Mild aortic valve regurgitation.   Pulmonic Valve  The pulmonic valve was not well visualized but appears normal in structure and function   Ascending Aorta  The aortic root is normal in size. The ascending aorta is normal in size.   Pericardium  There is no pericardial effusion.   Study Details  A complete echo was performed using complete 2D, color flow Doppler and spectral Doppler. Overall the study quality was adequate. The imaging is on file and stored in a permanent location. " [TY]    Clinical Course User Index [TY] Coral Spikes, DO                                 Medical Decision Making This is an 83 year old female presenting emergency department for shortness of breath and generalized malaise.  Afebrile nontachycardic slightly hypertensive.  Does not appear to be in distress.  Clear lungs.  Does have some lower extremity edema, but she states largely unchanged from baseline.  No documented history of CHF.  Her symptoms do seemingly sound viral, however flu/COVID/RSV negative.  Chest x-ray without pneumonia or pneumothorax on my independent interpretation.  No pleural effusions reported that she states she had from urgent care on Saturday.  No leukocytosis to suggest systemic infection.  Hemoglobin 10.7, but similar to prior.  No overt bleeding.  Mild hypokalemia, will replete.  Baseline kidney function/CKD.  No transaminitis to  suggest hepatobiliary disease.  Her troponin negative.  Her BNP is  elevated at 1327, however again largely asymptomatic other than subjective shortness of breath as she does not appear to be in distress, and not having shortness of breath.  Low suspicion for PE as she is low risk Wells criteria.  Shared decision making with patient regarding admission for further evaluation of her elevated BNP/shortness of breath versus resuming daily Lasix and following up with cardiology.  Patient would like to follow-up outpatient. Discussed supportive management for viral URI as well.  Will discharge at patient request.  Amount and/or Complexity of Data Reviewed Labs: ordered. Radiology: ordered. ECG/medicine tests: ordered.  Risk Prescription drug management.         Final Clinical Impression(s) / ED Diagnoses Final diagnoses:  None    Rx / DC Orders ED Discharge Orders     None         Coral Spikes, DO 01/17/24 1314

## 2024-01-17 NOTE — Discharge Instructions (Signed)
 Please take a double dose of your water pill today and tomorrow and then resume normal daily dosing thereafter.  Please follow-up with your cardiologist.  You may take over-the-counter Mucinex as well to help thin secretions.  Return immediately if develop fevers, chills, chest pain, worsening shortness of breath, lightheadedness, passout, palpitations or any new or worsening symptoms that are concerning to you.

## 2024-01-17 NOTE — ED Triage Notes (Signed)
 Pt reports that she went to the UC on Saturday and that they told her that she has CHF and plural effusions. Pt states that she hasn't felt  better and wanted to come to be seen. Pt has  pacemaker.

## 2024-01-19 ENCOUNTER — Encounter (HOSPITAL_BASED_OUTPATIENT_CLINIC_OR_DEPARTMENT_OTHER): Payer: Self-pay | Admitting: Emergency Medicine

## 2024-01-19 ENCOUNTER — Emergency Department (HOSPITAL_BASED_OUTPATIENT_CLINIC_OR_DEPARTMENT_OTHER)
Admission: EM | Admit: 2024-01-19 | Discharge: 2024-01-19 | Disposition: A | Discharge status: Home / Self Care | Attending: Emergency Medicine | Admitting: Emergency Medicine

## 2024-01-19 ENCOUNTER — Emergency Department (HOSPITAL_BASED_OUTPATIENT_CLINIC_OR_DEPARTMENT_OTHER)

## 2024-01-19 DIAGNOSIS — E876 Hypokalemia: Secondary | ICD-10-CM | POA: Diagnosis not present

## 2024-01-19 DIAGNOSIS — B974 Respiratory syncytial virus as the cause of diseases classified elsewhere: Secondary | ICD-10-CM | POA: Diagnosis not present

## 2024-01-19 DIAGNOSIS — Z7982 Long term (current) use of aspirin: Secondary | ICD-10-CM | POA: Diagnosis not present

## 2024-01-19 DIAGNOSIS — E871 Hypo-osmolality and hyponatremia: Secondary | ICD-10-CM | POA: Diagnosis not present

## 2024-01-19 DIAGNOSIS — I129 Hypertensive chronic kidney disease with stage 1 through stage 4 chronic kidney disease, or unspecified chronic kidney disease: Secondary | ICD-10-CM | POA: Diagnosis not present

## 2024-01-19 DIAGNOSIS — Z79899 Other long term (current) drug therapy: Secondary | ICD-10-CM | POA: Diagnosis not present

## 2024-01-19 DIAGNOSIS — J210 Acute bronchiolitis due to respiratory syncytial virus: Secondary | ICD-10-CM

## 2024-01-19 DIAGNOSIS — E1122 Type 2 diabetes mellitus with diabetic chronic kidney disease: Secondary | ICD-10-CM | POA: Insufficient documentation

## 2024-01-19 DIAGNOSIS — N189 Chronic kidney disease, unspecified: Secondary | ICD-10-CM | POA: Diagnosis not present

## 2024-01-19 DIAGNOSIS — R0981 Nasal congestion: Secondary | ICD-10-CM | POA: Diagnosis not present

## 2024-01-19 DIAGNOSIS — R051 Acute cough: Secondary | ICD-10-CM | POA: Insufficient documentation

## 2024-01-19 DIAGNOSIS — R059 Cough, unspecified: Secondary | ICD-10-CM | POA: Diagnosis present

## 2024-01-19 DIAGNOSIS — Z7901 Long term (current) use of anticoagulants: Secondary | ICD-10-CM | POA: Insufficient documentation

## 2024-01-19 DIAGNOSIS — I4891 Unspecified atrial fibrillation: Secondary | ICD-10-CM | POA: Diagnosis not present

## 2024-01-19 LAB — BASIC METABOLIC PANEL
Anion gap: 13 (ref 5–15)
BUN: 30 mg/dL — ABNORMAL HIGH (ref 8–23)
CO2: 25 mmol/L (ref 22–32)
Calcium: 8.6 mg/dL — ABNORMAL LOW (ref 8.9–10.3)
Chloride: 96 mmol/L — ABNORMAL LOW (ref 98–111)
Creatinine, Ser: 1.95 mg/dL — ABNORMAL HIGH (ref 0.44–1.00)
GFR, Estimated: 25 mL/min — ABNORMAL LOW (ref >60)
Glucose, Bld: 140 mg/dL — ABNORMAL HIGH (ref 70–99)
Potassium: 3.2 mmol/L — ABNORMAL LOW (ref 3.5–5.1)
Sodium: 134 mmol/L — ABNORMAL LOW (ref 135–145)

## 2024-01-19 LAB — CBC WITH DIFFERENTIAL/PLATELET
Abs Immature Granulocytes: 0.02 10*3/uL (ref 0.00–0.07)
Basophils Absolute: 0 10*3/uL (ref 0.0–0.1)
Basophils Relative: 0 %
Eosinophils Absolute: 0.1 10*3/uL (ref 0.0–0.5)
Eosinophils Relative: 3 %
HCT: 33.2 % — ABNORMAL LOW (ref 36.0–46.0)
Hemoglobin: 11 g/dL — ABNORMAL LOW (ref 12.0–15.0)
Immature Granulocytes: 1 %
Lymphocytes Relative: 32 %
Lymphs Abs: 1.4 10*3/uL (ref 0.7–4.0)
MCH: 30.5 pg (ref 26.0–34.0)
MCHC: 33.1 g/dL (ref 30.0–36.0)
MCV: 92 fL (ref 80.0–100.0)
Monocytes Absolute: 0.6 10*3/uL (ref 0.1–1.0)
Monocytes Relative: 14 %
Neutro Abs: 2.2 10*3/uL (ref 1.7–7.7)
Neutrophils Relative %: 50 %
Platelets: 189 10*3/uL (ref 150–400)
RBC: 3.61 MIL/uL — ABNORMAL LOW (ref 3.87–5.11)
RDW: 14.6 % (ref 11.5–15.5)
WBC: 4.4 10*3/uL (ref 4.0–10.5)
nRBC: 0 % (ref 0.0–0.2)

## 2024-01-19 LAB — RESP PANEL BY RT-PCR (RSV, FLU A&B, COVID)  RVPGX2
Influenza A by PCR: NEGATIVE
Influenza B by PCR: NEGATIVE
Resp Syncytial Virus by PCR: POSITIVE — AB
SARS Coronavirus 2 by RT PCR: NEGATIVE

## 2024-01-19 MED ORDER — IPRATROPIUM-ALBUTEROL 0.5-2.5 (3) MG/3ML IN SOLN
3.0000 mL | Freq: Once | RESPIRATORY_TRACT | Status: AC
  Administered 2024-01-19: 3 mL via RESPIRATORY_TRACT
  Filled 2024-01-19: qty 3

## 2024-01-19 MED ORDER — AEROCHAMBER PLUS FLO-VU MEDIUM MISC
1.0000 | Freq: Once | Status: AC
  Administered 2024-01-19: 1
  Filled 2024-01-19: qty 1

## 2024-01-19 MED ORDER — ALBUTEROL SULFATE (2.5 MG/3ML) 0.083% IN NEBU
2.5000 mg | INHALATION_SOLUTION | Freq: Once | RESPIRATORY_TRACT | Status: AC
  Administered 2024-01-19: 2.5 mg via RESPIRATORY_TRACT
  Filled 2024-01-19: qty 3

## 2024-01-19 MED ORDER — BENZONATATE 100 MG PO CAPS: 100.0000 mg | ORAL_CAPSULE | Freq: Three times a day (TID) | ORAL | 0 refills | Status: DC | PRN

## 2024-01-19 MED ORDER — ALBUTEROL SULFATE HFA 108 (90 BASE) MCG/ACT IN AERS
1.0000 | INHALATION_SPRAY | Freq: Once | RESPIRATORY_TRACT | Status: DC
  Filled 2024-01-19: qty 6.7

## 2024-01-19 MED ORDER — PREDNISONE 20 MG PO TABS: 40.0000 mg | ORAL_TABLET | Freq: Every day | ORAL | 0 refills | Status: AC

## 2024-01-19 MED ORDER — ACETAMINOPHEN 500 MG PO TABS: 1000.0000 mg | ORAL_TABLET | Freq: Once | ORAL | Status: DC

## 2024-01-19 MED ORDER — TRIAMCINOLONE ACETONIDE 55 MCG/ACT NA AERO: 2.0000 | INHALATION_SPRAY | Freq: Every day | NASAL | 12 refills | Status: DC

## 2024-01-19 NOTE — ED Provider Notes (Signed)
 [place] [facility] AT [facility] Provider Note   CSN: [id number] Arrival date & time: 01/19/24  1149     History  Chief Complaint  Patient presents with   Cough    Angie Barrett is a 83 y.o. female.   Cough   83 year old female presents emergency department with complaints of cough, congestion, wheezing.  States she has been ill with symptoms since Saturday.  Denies any known sick contact.  Was seen 2 days ago in the emergency department with overall reassuring workup.  Presents due to continued cough as well as nasal congestion.  Has been taking over-the-counter Mucinex as well as allergy medicine with some improvement of symptoms.  Denies any fevers, shortness of breath, chest pain, abdominal pain, nausea, vomiting, urinary symptoms, change in bowel habits.  Past medical history significant for CKD, A-fib on Eliquis, diabetes mellitus, hypertension, hypercholesterolemia  Home Medications Prior to Admission medications   Medication Sig Start Date End Date Taking? Authorizing Provider  benzonatate (TESSALON) 100 MG capsule Take 1 capsule (100 mg total) by mouth 3 (three) times daily as needed. 01/19/24  Yes Sherian Maroon A, PA  predniSONE (DELTASONE) 20 MG tablet Take 2 tablets (40 mg total) by mouth daily with breakfast for 5 days. 01/19/24 01/24/24 Yes Sherian Maroon A, PA  triamcinolone (NASACORT) 55 MCG/ACT AERO nasal inhaler Place 2 sprays into the nose daily. 01/19/24  Yes Sherian Maroon A, PA  acetaminophen (TYLENOL) 500 MG tablet Take 2 tablets (1,000 mg total) by mouth every 6 (six) hours as needed. 05/22/18   Arby Barrette, MD  aspirin EC 81 MG tablet Take 81 mg by mouth daily. Swallow whole. Patient not taking: Reported on 09/02/2023    [provider]  calcitRIOL (ROCALTROL) 0.5 MCG capsule Take 0.5 mcg by mouth daily.    [provider]  calcium carbonate (OSCAL) 1500 (600 Ca) MG TABS tablet Take 600 mg of elemental calcium by mouth  2 (two) times daily with a meal.    [provider]  carvedilol (COREG) 6.25 MG tablet Take by mouth. Patient not taking: Reported on 09/29/2023 10/04/19   [provider]  cholecalciferol (VITAMIN D3) 25 MCG (1000 UNIT) tablet Take 2,000 Units by mouth daily.    [provider]  conjugated estrogens (PREMARIN) vaginal cream Place 1 Applicatorful vaginally 2 (two) times a week.    [provider]  Continuous Glucose Sensor (DEXCOM G7 SENSOR) MISC by Does not apply route.    [provider]  ELIQUIS 2.5 MG TABS tablet Take 2.5 mg by mouth 2 (two) times daily. 11/03/21   [provider]  EPINEPHrine 0.3 mg/0.3 mL IJ SOAJ injection Inject 0.3 mg into the muscle as needed for anaphylaxis. 08/27/20   Alphonzo Severance, MD  felodipine (PLENDIL) 10 MG 24 hr tablet Take by mouth. 10/07/22   [provider]  fluticasone (FLONASE) 50 MCG/ACT nasal spray Place 1 spray into both nostrils daily. Patient not taking: Reported on 10/26/2023 04/20/23   Ferol Luz, MD  ipratropium (ATROVENT) 0.06 % nasal spray Place 2 sprays into both nostrils 4 (four) times daily. As needed for nasal congestion, runny nose 09/29/23   Ferol Luz, MD  levocetirizine (XYZAL) 5 MG tablet Take 1 tablet (5 mg total) by mouth every evening. 09/29/23   Ferol Luz, MD  linagliptin (TRADJENTA) 5 MG TABS tablet Take 5 mg by mouth daily.    [provider]  metoprolol succinate (TOPROL-XL) 25 MG 24 hr tablet Take 25  mg by mouth daily.    [provider]  montelukast (SINGULAIR) 10 MG tablet Take 1 tablet (10 mg total) by mouth at bedtime. 09/29/23   Ferol Luz, MD  NIFEdipine (PROCARDIA XL/NIFEDICAL-XL) 90 MG 24 hr tablet Take 90 mg by mouth daily.    [provider]  Olopatadine HCl 0.2 % SOLN Apply 1 drop to eye daily. Patient not taking: Reported on 10/26/2023 09/29/23   Ferol Luz, MD  omeprazole (PRILOSEC) 40 MG capsule Take  40 mg by mouth daily.    [provider]  pioglitazone (ACTOS) 15 MG tablet Take 15 mg by mouth daily.    [provider]  potassium chloride SA (KLOR-CON M) 20 MEQ tablet Take 1 tablet (20 mEq total) by mouth daily for 7 days. 02/28/22 03/07/22  Janell Quiet, PA-C  rosuvastatin (CRESTOR) 40 MG tablet Take 40 mg by mouth daily.    [provider]  torsemide (DEMADEX) 20 MG tablet Take 20 mg by mouth daily.    [provider]      Allergies    Amoxicillin-pot clavulanate, Ciprofloxacin, Clotrimazole, Nystatin, Olmesartan, Hydrochlorothiazide, Metformin, Other, Parathyroid hormone (recomb), Spironolactone, Telithromycin, Teriparatide, Tramadol, and Ace inhibitors    Review of Systems   Review of Systems  Respiratory:  Positive for cough.   All other systems reviewed and are negative.   Physical Exam Updated Vital Signs BP (!) 112/53 (BP Location: Left Arm)   Pulse 77   Temp 99.9 F (37.7 C)   Resp 18   Ht 5\' 4"  (1.626 m)   Wt 72 kg   SpO2 96%   BMI 27.25 kg/m  Physical Exam Vitals and nursing note reviewed.  Constitutional:      General: She is not in acute distress.    Appearance: She is well-developed.  HENT:     Head: Normocephalic and atraumatic.  Eyes:     Conjunctiva/sclera: Conjunctivae normal.  Cardiovascular:     Rate and Rhythm: Normal rate and regular rhythm.  Pulmonary:     Effort: Pulmonary effort is normal. No respiratory distress.     Breath sounds: Wheezing present. No rhonchi or rales.  Abdominal:     Palpations: Abdomen is soft.     Tenderness: There is no abdominal tenderness.  Musculoskeletal:        General: No swelling.     Cervical back: Neck supple.  Skin:    General: Skin is warm and dry.     Capillary Refill: Capillary refill takes less than 2 seconds.  Neurological:     Mental Status: She is alert.  Psychiatric:        Mood and Affect: Mood normal.     ED Results / Procedures / Treatments    Labs (all labs ordered are listed, but only abnormal results are displayed) Labs Reviewed  RESP PANEL BY RT-PCR (RSV, FLU A&B, COVID)  RVPGX2 - Abnormal; Notable for the following components:      Result Value   Resp Syncytial Virus by PCR POSITIVE (*)    All other components within normal limits  CBC WITH DIFFERENTIAL/PLATELET - Abnormal; Notable for the following components:   RBC 3.61 (*)    Hemoglobin 11.0 (*)    HCT 33.2 (*)    All other components within normal limits  BASIC METABOLIC PANEL - Abnormal; Notable for the following components:   Sodium 134 (*)    Potassium 3.2 (*)    Chloride 96 (*)    Glucose, Bld 140 (*)  BUN 30 (*)    Creatinine, Ser 1.95 (*)    Calcium 8.6 (*)    GFR, Estimated 25 (*)    All other components within normal limits    EKG None  Radiology DG Chest 2 View Result Date: 01/19/2024 CLINICAL DATA:  Cough and runny nose.  Three days EXAM: CHEST - 2 VIEW COMPARISON:  X-ray 01/17/2024 and older FINDINGS: Stable cardiopericardial silhouette. Calcified aorta. Left upper chest pacemaker. No consolidation, pneumothorax or effusion. No edema. Degenerative changes along the spine. IMPRESSION: No significant interval change. Electronically Signed   By: Karen Kays M.D.   On: 01/19/2024 15:59    Procedures Procedures    Medications Ordered in ED Medications  albuterol (VENTOLIN HFA) 108 (90 Base) MCG/ACT inhaler 1-2 puff ( Inhalation Not Given 01/19/24 1550)  acetaminophen (TYLENOL) tablet 1,000 mg (1,000 mg Oral Not Given 01/19/24 1524)  ipratropium-albuterol (DUONEB) 0.5-2.5 (3) MG/3ML nebulizer solution 3 mL (3 mLs Nebulization Given 01/19/24 1550)  albuterol (PROVENTIL) (2.5 MG/3ML) 0.083% nebulizer solution 2.5 mg (2.5 mg Nebulization Given 01/19/24 1550)  AeroChamber Plus Flo-Vu Medium MISC 1 each (1 each Other Given 01/19/24 1554)    ED Course/ Medical Decision Making/ A&P                                 Medical Decision Making Amount and/or  Complexity of Data Reviewed Labs: ordered. Radiology: ordered.  Risk OTC drugs. Prescription drug management.   This patient presents to the ED for concern of cough, congestion, this involves an extensive number of treatment options, and is a complaint that carries with it a high risk of complications and morbidity.  The differential diagnosis includes COVID, flu, RSV, pneumonia, asthma, COPD, malignancy, other   Co morbidities that complicate the patient evaluation  See HPI   Additional history obtained:  Additional history obtained from EMR External records from outside source obtained and reviewed including hospital records   Lab Tests:  I Ordered, and personally interpreted labs.  The pertinent results include: Viral testing positive for RSV.  No leukocytosis.  Anemia of 11 hemoglobin.  Platelets within range.  Patient with baseline renal dysfunction BUN of 30, creatinine 1.95, GFR of 25.  Slight hyponatremia, hypokalemia 134 3.2 respectively.   Imaging Studies ordered:  I ordered imaging studies including chest x-ray I independently visualized and interpreted imaging which showed no acute cardiopulmonary abnormality I agree with the radiologist interpretation   Cardiac Monitoring: / EKG:  The patient was maintained on a cardiac monitor.  I personally viewed and interpreted the cardiac monitored which showed an underlying rhythm of: Sinus rhythm   Consultations Obtained:  N/a   Problem List / ED Course / Critical interventions / Medication management  RSV, cough, nasal congestion I ordered medication including albuterol, DuoNeb, Tylenol   Reevaluation of the patient after these medicines showed that the patient improved I have reviewed the patients home medicines and have made adjustments as needed   Social Determinants of Health:  Denies tobacco, illicit drug use   Test / Admission - Considered:  RSV, cough, nasal congestion Vitals signs within normal  range and stable throughout visit. Laboratory/imaging studies significant for: see above 83 year old female presents again to the emergency department with complaints of cough, nasal congestion, wheezing.  Symptoms present for the past 3 days or so.  On exam, auscultatory wheeze appreciated bilateral lung fields.  Patient's workup today overall reassuring.  Laboratory studies  without evidence of acute emergent process from patient's baseline laboratory studies.  Viral testing positive for RSV which is most likely causing symptoms.  Chest x-ray obtained by triage staff was negative for pneumonia or other acute cardiopulmonary abnormalities.  Suspect the patient's symptoms are likely secondary to RSV.  Suspect bronchitic lung changes given wheeze on exam with improvement with albuterol inhaler.  Will place patient on short course of corticosteroids along with recommendation of continued use of albuterol inhaler in the outpatient setting for treatment of wheeze.  Will recommend additional symptomatic therapy as described in AVS and close follow-up with PCP in the outpatient setting.  Treatment plan discussed at length with patient and she acknowledged understanding was agreeable to said plan.  Patient overall well-appearing, afebrile, nonhypoxic in no acute distress. Worrisome signs and symptoms were discussed with the patient, and the patient acknowledged understanding to return to the ED if noticed. Patient was stable upon discharge.          Final Clinical Impression(s) / ED Diagnoses Final diagnoses:  RSV (acute bronchiolitis due to respiratory syncytial virus)  Acute cough  Nasal congestion    Rx / DC Orders ED Discharge Orders     None         Peter Garter, [place] 01/19/24 1616    Virgina Norfolk, DO 01/19/24 1750

## 2024-01-19 NOTE — Discharge Instructions (Signed)
 As discussed, you did test positive for RSV.  Your chest x-ray did not show evidence of pneumonia.  Will send you home with a inhaler to use as needed for wheezing as well as short course of steroids.  Also send in cough medicine to use as needed.  Continue to use your allergy medicine.  Will also recommend nasal steroid spray in the form of Nasacort/Flonase.  Recommend close follow-up with your primary care for reassessment.  Please do not hesitate to return if the worrisome signs and symptoms we discussed become apparent.

## 2024-01-19 NOTE — ED Triage Notes (Signed)
 C/o cough, runny nose, and "increased mucous" x 3 days. Seen here on 3/3 for similar. Negative swabs. States was told to come back if symptoms have not improved.

## 2024-03-02 ENCOUNTER — Ambulatory Visit: Payer: Medicare HMO | Admitting: Internal Medicine

## 2024-03-28 ENCOUNTER — Ambulatory Visit: Payer: Medicare HMO | Admitting: Internal Medicine

## 2024-04-02 ENCOUNTER — Other Ambulatory Visit: Payer: Self-pay | Admitting: Internal Medicine

## 2024-04-05 ENCOUNTER — Ambulatory Visit (INDEPENDENT_AMBULATORY_CARE_PROVIDER_SITE_OTHER): Admitting: Internal Medicine

## 2024-04-05 ENCOUNTER — Encounter: Payer: Self-pay | Admitting: Internal Medicine

## 2024-04-05 VITALS — BP 136/72 | HR 84 | Temp 98.3°F | Resp 20 | Wt 163.0 lb

## 2024-04-05 DIAGNOSIS — T783XXD Angioneurotic edema, subsequent encounter: Secondary | ICD-10-CM | POA: Diagnosis not present

## 2024-04-05 DIAGNOSIS — J3089 Other allergic rhinitis: Secondary | ICD-10-CM | POA: Diagnosis not present

## 2024-04-05 DIAGNOSIS — H1045 Other chronic allergic conjunctivitis: Secondary | ICD-10-CM | POA: Diagnosis not present

## 2024-04-05 NOTE — Patient Instructions (Signed)
 Angioedema (tissue swelling): histaminergic  Well controlled  Continue  Zyzal 5mg  daily   2. Chronic Rhinitis  Continue Dust mite precautions as below.  Continue Atrovent  (ipatopium) nasal spray 1 sprays in each nostril every other day  Montelukast  10mg  at night  Continue sinus rinses daily   Follow up:  6 months

## 2024-04-05 NOTE — Progress Notes (Signed)
   FOLLOW UP Date of Service/Encounter:  04/05/24  Subjective:  Angie Barrett (DOB: [birth year]) is a 83 y.o. female who [facility] in re-evaluation of the following: Acute visit for runny nose History obtained from: chart review and patient.  For Review, LV was on  12/24/24with Dr. Jolayne Natter seen for routine follow-up.  For angioedema, histamine mediated and perennial allergic rhinitis See below for summary of history and diagnostics.   Therapeutic plans/changes recommended: Decrease Atrovent  due to nasal dryness. ----------------------------------------------------- Pertinent History/Diagnostics:  Angioedema:  HAE and acquired angioedema was negative . Controlled on zyrtec 10 mg daily. Dust mite specific IgE was positive but rest negative environmental panel. Food testing was negative to mammalian meat, orange, citrus.  --------------------------------------------------- Today presents for follow-up. Discussed the use of AI scribe software for clinical note transcription with the patient, who gave verbal consent to proceed. History of Present Illness Angie Barrett is an 83 year old female who presents for follow-up on her allergy management.  She has not experienced any swelling episodes recently and continues to take Zyrtec and montelukast  every night. She uses eye drops and atrovent  nasal spray as needed, noting that she has reduced the use of nose sprays due to previous dryness, which has since improved. No side effects from her current medications and she is doing well overall.  She survived the pollen season without significant issues. No new swelling episodes or side effects from her medications.   All medications reviewed by clinical staff and updated in chart. No new pertinent medical or surgical history except as noted in HPI.  ROS: All others negative except as noted per HPI.   Objective:  BP 136/72 (BP Location: Left Arm, Patient  Position: Sitting, Cuff Size: Large)   Pulse 84   Temp 98.3 F (36.8 C) (Oral)   Resp 20   Wt 163 lb (73.9 kg)   SpO2 99%   BMI 27.98 kg/m  Body mass index is 27.98 kg/m. Physical Exam: General Appearance:  Alert, cooperative, no distress, appears stated age  Head:  Normocephalic, without obvious abnormality, atraumatic  Eyes:  Conjunctiva clear, EOM's intact  Ears EACs normal bilaterally and normal TMs bilaterally  Nose: Nares normal, normal mucosa,  normal turbinates , no visible anterior polyps, and septum midline  Throat: Lips, tongue normal; teeth and gums normal, normal posterior oropharynx  Neck: Supple, symmetrical  Lungs:   clear to auscultation bilaterally, Respirations unlabored, no coughing  Heart:  regular rate and rhythm and no murmur, Appears well perfused  Extremities: No edema  Skin: Skin color, texture, turgor normal and no rashes or lesions on visualized portions of skin  Neurologic: No gross deficits   Labs:  Lab Orders  No laboratory test(s) ordered today    Assessment/Plan   Angioedema (tissue swelling): histaminergic  Well controlled  Continue  Zyzal 5mg  daily   2. Chronic Rhinitis  Continue Dust mite precautions as below.  Continue Atrovent  (ipatopium) nasal spray 1 sprays in each nostril every other day  Montelukast  10mg  at night  Continue sinus rinses daily   Follow up:  6 months    Thank you so much for letting me partake in your care today.  Don't hesitate to reach out if you have any additional concerns!  Orelia Binet, MD  [facility] [place] [place]

## 2024-04-25 ENCOUNTER — Ambulatory Visit: Payer: Medicare HMO | Admitting: Family Medicine

## 2024-05-05 ENCOUNTER — Other Ambulatory Visit: Payer: Self-pay | Admitting: Internal Medicine

## 2024-07-07 ENCOUNTER — Other Ambulatory Visit: Payer: Self-pay

## 2024-07-07 NOTE — Patient Outreach (Signed)
 Aging Gracefully Program  07/07/2024  Angie Barrett 1941/01/27 [id number]   [facility] Evaluation Interviewer attempted to call patient on today regarding Aging Gracefully referral. No answer from patient after multiple rings. CMA left confidential voicemail for patient to return call.  Will attempt to call back within 1 week.    [facility]  Population Health Care Management Assistant  Direct Dial: [phone]  Fax: [phone] Website: [email or web address]

## 2024-07-11 ENCOUNTER — Other Ambulatory Visit: Payer: Self-pay

## 2024-07-11 NOTE — Patient Outreach (Signed)
 Aging Gracefully Program  07/11/2024  Angie Barrett 06/24/1941 [id number]   [facility] Evaluation Interviewer made contact with patient. Aging Gracefully survey completed.   Interviewer will send referral to RN and OT for follow up.    [facility]  Population Health Care Management Assistant  Direct Dial: [phone]  Fax: [phone] Website: [email or web address]

## 2024-07-21 ENCOUNTER — Encounter: Payer: Self-pay | Admitting: Rehabilitation

## 2024-07-28 ENCOUNTER — Other Ambulatory Visit: Payer: Self-pay | Admitting: Rehabilitation

## 2024-07-29 NOTE — Consult Note (Signed)
 Aging Gracefully Program  07/28/24: OT met with Angie Barrett in her home. Through discussion about the AG program, she expressed that OT and Nursing were not needed for her at this time. But she would like to continue with [facility]- community housing solutions for bathroom improvements. OT sent a note to [facility] per the AG program and alerted the AG team that she will not continue.

## 2024-10-09 ENCOUNTER — Encounter: Payer: Self-pay | Admitting: Internal Medicine

## 2024-10-09 ENCOUNTER — Ambulatory Visit: Admitting: Internal Medicine

## 2024-10-09 ENCOUNTER — Other Ambulatory Visit: Payer: Self-pay

## 2024-10-09 VITALS — BP 134/86 | HR 91 | Temp 97.7°F | Resp 20 | Wt 164.8 lb

## 2024-10-09 DIAGNOSIS — J3089 Other allergic rhinitis: Secondary | ICD-10-CM

## 2024-10-09 DIAGNOSIS — T783XXD Angioneurotic edema, subsequent encounter: Secondary | ICD-10-CM | POA: Diagnosis not present

## 2024-10-09 DIAGNOSIS — H1045 Other chronic allergic conjunctivitis: Secondary | ICD-10-CM

## 2024-10-09 DIAGNOSIS — B9789 Other viral agents as the cause of diseases classified elsewhere: Secondary | ICD-10-CM

## 2024-10-09 DIAGNOSIS — J988 Other specified respiratory disorders: Secondary | ICD-10-CM

## 2024-10-09 NOTE — Patient Instructions (Addendum)
 Angioedema (tissue swelling): histaminergic  Well controlled  Continue  Zyzal 5mg  daily   2. Chronic Rhinitis and Conjunctivitis  Continue Dust mite precautions as below.  Continue Atrovent  (ipatopium) nasal spray 1 sprays in each nostril every other day  Can use for cold like symptoms as well  Montelukast  10mg  at night  Continue sinus rinses daily  Olopatadine  eye drops:  can use daily as needed for RED ITCHY WATERY EYES  Refresh eye drops: can use for dry itchy eyes   Follow up:  6 months   DUST MITE AVOIDANCE MEASURES:  There are three main measures that need and can be taken to avoid house dust mites:  Reduce accumulation of dust in general -reduce furniture, clothing, carpeting, books, stuffed animals, especially in bedroom  Separate yourself from the dust -use pillow and mattress encasements (can be found at stores such as [employer] or online) -avoid direct exposure to air condition flow -use a HEPA filter device, especially in the bedroom; you can also use a HEPA filter vacuum cleaner -wipe dust with a moist towel instead of a dry towel or broom when cleaning  Decrease mites and/or their secretions -wash clothing and linen and stuffed animals at highest temperature possible, at least every 2 weeks -stuffed animals can also be placed in a bag and put in a freezer overnight  Despite the above measures, it is impossible to eliminate dust mites or their allergen completely from your home.  With the above measures the burden of mites in your home can be diminished, with the goal of minimizing your allergic symptoms.  Success will be reached only when implementing and using all means together.

## 2024-10-09 NOTE — Progress Notes (Unsigned)
 FOLLOW UP Date of Service/Encounter:  10/09/24  Subjective:  Angie Barrett (DOB: Sep 19, 1941) is a 83 y.o. female who [facility]24 in re-evaluation of the following: Acute visit for runny nose History obtained from: chart review and patient.  For Review, LV was on  04/05/24 with Dr. Lorin seen for routine follow-up.  For angioedema, histamine mediated and perennial allergic rhinitis See below for summary of history and diagnostics.   Therapeutic plans/changes recommended: Decrease Atrovent  due to nasal dryness. ----------------------------------------------------- Pertinent History/Diagnostics:  Angioedema:  HAE and acquired angioedema was negative . Controlled on zyrtec 10 mg daily. Dust mite specific IgE was positive but rest negative environmental panel. Food testing was negative to mammalian meat, orange, citrus.  --------------------------------------------------- Today presents for follow-up. Discussed the use of AI scribe software for clinical note transcription with the patient, who gave verbal consent to proceed. History of Present Illness Angie Barrett is an 83 year old female who presents with eye symptoms and allergy management.  Ocular symptoms - She has been prescribed refresh eyedrops from ophthalmology -She has olopatadine  eyedrops from us , she is unsure which when she should use. - Initial cessation of Refresh and initiation of olopatadine  resulted in worsening eye watering  Allergic rhinitis and antihistamine use - Currently taking Xyzal  (levocetirizine) at night,  - No episodes of angioedema since starting these medications - Uses nasal spray as needed but has not required frequent use,   Upper respiratory symptoms - Cold-like symptoms with productive cough primarily during the day - Rhinorrhea present - No fever - Received three COVID vaccinations - Concerned about possible viral infection but does not believe it is  COVID - Symptoms have been present for 3 days   All medications reviewed by clinical staff and updated in chart. No new pertinent medical or surgical history except as noted in HPI.  ROS: All others negative except as noted per HPI.   Objective:  There were no vitals taken for this visit. There is no height or weight on file to calculate BMI. Physical Exam: General Appearance:  Alert, cooperative, no distress, appears stated age  Head:  Normocephalic, without obvious abnormality, atraumatic  Eyes:  Conjunctiva clear, EOM's intact  Ears EACs normal bilaterally and normal TMs bilaterally  Nose: Nares normal, normal mucosa,  normal turbinates , no visible anterior polyps, and septum midline  Throat: Lips, tongue normal; teeth and gums normal, normal posterior oropharynx  Neck: Supple, symmetrical  Lungs:   clear to auscultation bilaterally, Respirations unlabored, no coughing  Heart:  regular rate and rhythm and no murmur, Appears well perfused  Extremities: No edema  Skin: Skin color, texture, turgor normal and no rashes or lesions on visualized portions of skin  Neurologic: No gross deficits   Labs:  Lab Orders  No laboratory test(s) ordered today    Assessment/Plan   Patient Instructions  Angioedema (tissue swelling): histaminergic  Well controlled  Continue  Zyzal 5mg  daily   2. Chronic Rhinitis and Conjunctivitis  Continue Dust mite precautions as below.  Continue Atrovent  (ipatopium) nasal spray 1 sprays in each nostril every other day  Can use for cold like symptoms as well  Montelukast  10mg  at night  Continue sinus rinses daily  Olopatadine  eye drops:  can use daily as needed for RED ITCHY WATERY EYES  Refresh eye drops: can use for dry itchy eyes   Follow up:  6 months   DUST MITE AVOIDANCE MEASURES:  There are three main measures  that need and can be taken to avoid house dust mites:  Reduce accumulation of dust in general -reduce furniture, clothing,  carpeting, books, stuffed animals, especially in bedroom  Separate yourself from the dust -use pillow and mattress encasements (can be found at stores such as [employer] or online) -avoid direct exposure to air condition flow -use a HEPA filter device, especially in the bedroom; you can also use a HEPA filter vacuum cleaner -wipe dust with a moist towel instead of a dry towel or broom when cleaning  Decrease mites and/or their secretions -wash clothing and linen and stuffed animals at highest temperature possible, at least every 2 weeks -stuffed animals can also be placed in a bag and put in a freezer overnight  Despite the above measures, it is impossible to eliminate dust mites or their allergen completely from your home.  With the above measures the burden of mites in your home can be diminished, with the goal of minimizing your allergic symptoms.  Success will be reached only when implementing and using all means together.     Thank you so much for letting me partake in your care today.  Don't hesitate to reach out if you have any additional concerns!  Hargis Springer, MD  [facility] [place], [place]

## 2024-10-10 ENCOUNTER — Other Ambulatory Visit: Payer: Self-pay | Admitting: Internal Medicine

## 2024-10-27 ENCOUNTER — Other Ambulatory Visit: Payer: Self-pay

## 2024-10-27 ENCOUNTER — Encounter (HOSPITAL_BASED_OUTPATIENT_CLINIC_OR_DEPARTMENT_OTHER): Payer: Self-pay | Admitting: *Deleted

## 2024-10-27 ENCOUNTER — Inpatient Hospital Stay (HOSPITAL_BASED_OUTPATIENT_CLINIC_OR_DEPARTMENT_OTHER): Admission: EM | Admit: 2024-10-27 | Discharge: 2024-10-30 | Disposition: A

## 2024-10-27 ENCOUNTER — Emergency Department (HOSPITAL_BASED_OUTPATIENT_CLINIC_OR_DEPARTMENT_OTHER)

## 2024-10-27 DIAGNOSIS — I509 Heart failure, unspecified: Principal | ICD-10-CM

## 2024-10-27 DIAGNOSIS — I5033 Acute on chronic diastolic (congestive) heart failure: Secondary | ICD-10-CM | POA: Diagnosis present

## 2024-10-27 DIAGNOSIS — R0902 Hypoxemia: Secondary | ICD-10-CM

## 2024-10-27 DIAGNOSIS — N179 Acute kidney failure, unspecified: Secondary | ICD-10-CM

## 2024-10-27 LAB — BASIC METABOLIC PANEL WITH GFR
Anion gap: 15 (ref 5–15)
BUN: 35 mg/dL — ABNORMAL HIGH (ref 8–23)
CO2: 24 mmol/L (ref 22–32)
Calcium: 9.7 mg/dL (ref 8.9–10.3)
Chloride: 99 mmol/L (ref 98–111)
Creatinine, Ser: 2.53 mg/dL — ABNORMAL HIGH (ref 0.44–1.00)
GFR, Estimated: 18 mL/min — ABNORMAL LOW (ref >60)
Glucose, Bld: 142 mg/dL — ABNORMAL HIGH (ref 70–99)
Potassium: 3.7 mmol/L (ref 3.5–5.1)
Sodium: 138 mmol/L (ref 135–145)

## 2024-10-27 LAB — CBC
HCT: 27.9 % — ABNORMAL LOW (ref 36.0–46.0)
Hemoglobin: 9.4 g/dL — ABNORMAL LOW (ref 12.0–15.0)
MCH: 31 pg (ref 26.0–34.0)
MCHC: 33.7 g/dL (ref 30.0–36.0)
MCV: 92.1 fL (ref 80.0–100.0)
Platelets: 239 K/uL (ref 150–400)
RBC: 3.03 MIL/uL — ABNORMAL LOW (ref 3.87–5.11)
RDW: 15.2 % (ref 11.5–15.5)
WBC: 5.2 K/uL (ref 4.0–10.5)
nRBC: 0 % (ref 0.0–0.2)

## 2024-10-27 LAB — PRO BRAIN NATRIURETIC PEPTIDE: Pro Brain Natriuretic Peptide: 9493 pg/mL — ABNORMAL HIGH (ref <300.0)

## 2024-10-27 MED ORDER — FUROSEMIDE 10 MG/ML IJ SOLN
40.0000 mg | Freq: Once | INTRAMUSCULAR | Status: AC
  Administered 2024-10-27: 40 mg via INTRAVENOUS
  Filled 2024-10-27: qty 4

## 2024-10-27 NOTE — ED Notes (Signed)
 Placed patient on 2Lpm [place] at this time due to patient SATs 90%. RN aware.  Sats up to 95% on 2L.

## 2024-10-27 NOTE — ED Notes (Signed)
 RN entered pt room to find pt lying appx 20 degrees upright in bed, O2 sats noted to be 88% on 2L O2 via [place].  Pt appeared Short of breath, assisted to sit upright high fowlers. O2 increased to 4L O2 via [place], pt encouraged to take slow deep breaths in through nose, out through mouth.  O2 sats increased to 95% on 4L O2.

## 2024-10-27 NOTE — ED Notes (Signed)
 Patient transported to X-ray

## 2024-10-27 NOTE — ED Triage Notes (Addendum)
 Pt is here due to low iron (blood draw was Tuesday) and feeling sob.  Pt states that she began feeling sob this week and it has been increasing.  Pt ambulatory to triage. RT saw pt in triage, no wheezing. SOB increases with activity and exertion

## 2024-10-27 NOTE — ED Provider Notes (Signed)
 [place] [facility] Provider Note   CSN: [id number] Arrival date & time: 10/27/24  1414     Patient presents with: Shortness of Breath and Abnormal Lab   Angie Barrett is a 83 y.o. female who presented to the ED today secondary to persistent shortness of breath and fatigue as well as abnormal labs with low hemoglobin noted previously at primary care.  Review of previous medical records shows that on 9 December she had a hemoglobin of 9.6 with hematocrit of 29.9.  There was also a iron profile which showed normal total iron binding capacity however decreased iron saturation at 10%.  MCV was 95.  She states that she previously had a cough and purulent sputum however this resolved as this was several weeks prior to the current episode.  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  Review of the previous medical history shows diagnoses of atrial fibrillation, CKD, type 2 diabetes, hyperlipidemia, primary hypertension.    Shortness of Breath Abnormal Lab      Prior to Admission medications  Medication Sig Start Date End Date Taking? Authorizing Provider  acetaminophen  (TYLENOL ) 500 MG tablet Take 2 tablets (1,000 mg total) by mouth every 6 (six) hours as needed. 05/22/18   Armenta Canning, MD  aspirin EC 81 MG tablet Take 81 mg by mouth daily. Swallow whole. Patient not taking: Reported on 04/05/2024    [provider]  benzonatate  (TESSALON ) 100 MG capsule Take 1 capsule (100 mg total) by mouth 3 (three) times daily as needed. Patient not taking: Reported on 04/05/2024 01/19/24   Silver Fell A, PA  calcitRIOL (ROCALTROL) 0.5 MCG capsule Take 0.5 mcg by mouth daily.    [provider]  calcium  carbonate (OSCAL) 1500 (600 Ca) MG TABS tablet Take 600 mg of elemental calcium  by mouth 2 (two) times daily with a meal.    [provider]  carvedilol (COREG) 6.25 MG tablet Take by mouth. Patient not taking: Reported on 04/05/2024 10/04/19    [provider]  cholecalciferol  (VITAMIN D3) 25 MCG (1000 UNIT) tablet Take 2,000 Units by mouth daily.    [provider]  conjugated estrogens (PREMARIN) vaginal cream Place 1 Applicatorful vaginally 2 (two) times a week.    [provider]  Continuous Glucose Sensor (DEXCOM G7 SENSOR) MISC by Does not apply route.    [provider]  ELIQUIS  2.5 MG TABS tablet Take 2.5 mg by mouth 2 (two) times daily. 11/03/21   [provider]  EPINEPHrine  0.3 mg/0.3 mL IJ SOAJ injection Inject 0.3 mg into the muscle as needed for anaphylaxis. 08/27/20   Watson, Julia, MD  felodipine (PLENDIL) 10 MG 24 hr tablet Take by mouth. 10/07/22   [provider]  fluticasone  (FLONASE ) 50 MCG/ACT nasal spray Place 1 spray into both nostrils daily. 04/20/23   Lorin Norris, MD  ipratropium (ATROVENT ) 0.06 % nasal spray USE 2 SPRAYS IN EACH NOSTRIL FOUR TIMES DAILY AS NEEDED FOR NASAL CONGESTION OR RUNNY NOSE 10/10/24   Lorin Norris, MD  levocetirizine (XYZAL ) 5 MG tablet Take 1 tablet (5 mg total) by mouth every evening. 09/29/23   Lorin Norris, MD  linagliptin (TRADJENTA) 5 MG TABS tablet Take 5 mg by mouth daily.    [provider]  metoprolol  succinate (TOPROL -XL) 25 MG 24 hr tablet Take 25 mg by mouth daily.    [provider]  montelukast  (SINGULAIR ) 10 MG tablet TAKE 1 TABLET(10 MG) BY MOUTH AT BEDTIME 05/05/24  Lorin Norris, MD  NIFEdipine  (PROCARDIA  XL/NIFEDICAL-XL) 90 MG 24 hr tablet Take 90 mg by mouth daily. Patient not taking: Reported on 04/05/2024    [provider]  Olopatadine  HCl 0.2 % SOLN Apply 1 drop to eye daily. 09/29/23   Lorin Norris, MD  omeprazole (PRILOSEC) 40 MG capsule Take 40 mg by mouth daily.    [provider]  pioglitazone (ACTOS) 15 MG tablet Take 15 mg by mouth daily.    [provider]  potassium chloride  SA (KLOR-CON  M) 20 MEQ tablet Take 1 tablet (20 mEq total) by mouth  daily for 7 days. 02/28/22 03/07/22  Conklin, Erica R, PA-C  rosuvastatin  (CRESTOR ) 40 MG tablet Take 40 mg by mouth daily.    [provider]  torsemide  (DEMADEX ) 20 MG tablet Take 20 mg by mouth daily.    [provider]  triamcinolone  (NASACORT ) 55 MCG/ACT AERO nasal inhaler Place 2 sprays into the nose daily. 01/19/24   Silver Wonda LABOR, PA    Allergies: Amoxicillin-pot clavulanate, Ciprofloxacin, Clotrimazole, Nystatin, Olmesartan, Hydrochlorothiazide, Metformin, Other, Parathyroid hormone (recomb), Spironolactone, Telithromycin, Teriparatide , Tramadol, and Ace inhibitors    Review of Systems  Constitutional:  Positive for fatigue.  Respiratory:  Positive for shortness of breath.   All other systems reviewed and are negative.   Updated Vital Signs BP (!) 143/74   Pulse 69   Temp 98.1 F (36.7 C) (Oral)   Resp 19   SpO2 93%   Physical Exam Vitals and nursing note reviewed.  Constitutional:      General: She is awake. She is not in acute distress.    Appearance: Normal appearance. She is well-developed.  HENT:     Head: Normocephalic and atraumatic.     Mouth/Throat:     Mouth: Mucous membranes are moist.     Pharynx: Oropharynx is clear. Uvula midline.  Eyes:     Extraocular Movements: Extraocular movements intact.     Conjunctiva/sclera: Conjunctivae normal.     Pupils: Pupils are equal, round, and reactive to light.  Cardiovascular:     Rate and Rhythm: Normal rate and regular rhythm.     Pulses: Normal pulses.          Radial pulses are 2+ on the right side and 2+ on the left side.       Dorsalis pedis pulses are 2+ on the right side and 2+ on the left side.       Posterior tibial pulses are 2+ on the right side and 2+ on the left side.     Heart sounds: Normal heart sounds, S1 normal and S2 normal. No murmur heard.    No friction rub. No gallop.  Pulmonary:     Effort: Pulmonary effort is normal.     Breath sounds: Normal breath sounds and air  entry.  Abdominal:     General: Abdomen is flat. Bowel sounds are normal.     Palpations: Abdomen is soft.  Musculoskeletal:        General: Normal range of motion.     Cervical back: Normal range of motion and neck supple.     Right lower leg: 1+ Pitting Edema present.     Left lower leg: 1+ Pitting Edema present.  Skin:    General: Skin is warm and dry.     Capillary Refill: Capillary refill takes less than 2 seconds.  Neurological:     General: No focal deficit present.     Mental Status: She is  alert and oriented to person, place, and time. Mental status is at baseline.     GCS: GCS eye subscore is 4. GCS verbal subscore is 5. GCS motor subscore is 6.  Psychiatric:        Mood and Affect: Mood normal.        Behavior: Behavior is cooperative.     (all labs ordered are listed, but only abnormal results are displayed) Labs Reviewed  BASIC METABOLIC PANEL WITH GFR - Abnormal; Notable for the following components:      Result Value   Glucose, Bld 142 (*)    BUN 35 (*)    Creatinine, Ser 2.53 (*)    GFR, Estimated 18 (*)    All other components within normal limits  CBC - Abnormal; Notable for the following components:   RBC 3.03 (*)    Hemoglobin 9.4 (*)    HCT 27.9 (*)    All other components within normal limits  PRO BRAIN NATRIURETIC PEPTIDE - Abnormal; Notable for the following components:   Pro Brain Natriuretic Peptide 9,493.0 (*)    All other components within normal limits    EKG: EKG Interpretation Date/Time:  Friday October 27 2024 14:28:18 EST Ventricular Rate:  73 PR Interval:  75 QRS Duration:  179 QT Interval:  496 QTC Calculation: 547 R Axis:   -78  Text Interpretation: Sinus rhythm Short PR interval Nonspecific IVCD with LAD LVH with secondary repolarization abnormality similar to prior Confirmed by Elnor Savant ([id number]) on 10/27/2024 5:25:20 PM  Radiology: DG Chest 2 View Result Date: 10/27/2024 EXAM: 2 VIEW(S) XRAY OF THE CHEST 10/27/2024  02:40:00 PM COMPARISON: 01/19/2024 CLINICAL HISTORY: SOB FINDINGS: LINES, TUBES AND DEVICES: Left subclavian approach dual lead cardiac rhythm maintenance device stable in position. LUNGS AND PLEURA: Mild pulmonary edema. Patchy bibasilar opacities. Small bilateral pleural effusions. No pneumothorax. HEART AND MEDIASTINUM: Persistent cardiomegaly. Aortic atherosclerosis. BONES AND SOFT TISSUES: No acute osseous abnormality. IMPRESSION: 1. Mild pulmonary edema with small bilateral pleural effusions and patchy bibasilar atelectasis or aspiration. 2. Cardiomegaly with aortic atherosclerosis. Electronically signed by: Greig Pique MD 10/27/2024 03:11 PM EST RP Workstation: [id number]     Procedures   Medications Ordered in the ED  furosemide  (LASIX ) injection 40 mg (40 mg Intravenous Given 10/27/24 1735)    Clinical Course as of 10/27/24 1810  Fri Oct 27, 2024  1656 Pro Brain Natriuretic Peptide(!): 9,493.0 [JG]  1727 F/w cardiology at [facility] (Dr Lilian) [SG]  1728 Echo 2/24, LVEF 55-60%  [SG]    Clinical Course User Index [JG] Myriam Dorn BROCKS, PA [SG] Elnor Savant LABOR, DO                                 Medical Decision Making Amount and/or Complexity of Data Reviewed Labs: ordered. Decision-making details documented in ED Course. Radiology: ordered.  Risk Decision regarding hospitalization.   Medical Decision Making:   Salimatou Simone is a 83 y.o. female who presented to the ED today with increasing shortness of breath and fatigue detailed above.    External chart has been reviewed including labs, imaging, and family practice records. Patient's presentation is complicated by their history of stage IV CKD as well as has type 2 diabetes..  Patient placed on continuous vitals and telemetry monitoring while in ED which was reviewed periodically.  Complete initial physical exam performed, notably the patient  was alert and oriented in no apparent distress.  There is notable edema to the  bilateral lower extremities, normal respirations, though is requiring oxygen  where she has not required that before..    Reviewed and confirmed nursing documentation for past medical history, family history, social history.    Initial Assessment:   With the patient's presentation of shortness of breath and fatigue, differential diagnosis includes dyspnea and fatigue secondary to chronic anemia, pulmonary disease such as pneumonia, pulmonary effusion, bronchitis.  Cardiac etiologies such as new onset CHF given history of CKD also consider cardiorenal syndrome.   Initial Plan:  Assess BMP to evaluate for cardiorenal syndrome/new onset CHF Screening labs including CBC and Metabolic panel to evaluate for infectious or metabolic etiology of disease.  CXR to evaluate for structural/infectious intrathoracic pathology.  EKG to evaluate for cardiac pathology Objective evaluation as below reviewed   Initial Study Results:   Laboratory  All laboratory results reviewed without evidence of clinically relevant pathology.   Exceptions include: Creatinine is elevated to 2.53 with subsequent decrease in GFR to 18.  Hemoglobin level 9.4 with normal MCV.  proBNP is 9493.  EKG EKG was reviewed independently. Rate, rhythm, axis, intervals all examined and without medically relevant abnormality. ST segments without concerns for elevations.    Radiology:  All images reviewed independently. Agree with radiology report at this time.   DG Chest 2 View Result Date: 10/27/2024 EXAM: 2 VIEW(S) XRAY OF THE CHEST 10/27/2024 02:40:00 PM COMPARISON: 01/19/2024 CLINICAL HISTORY: SOB FINDINGS: LINES, TUBES AND DEVICES: Left subclavian approach dual lead cardiac rhythm maintenance device stable in position. LUNGS AND PLEURA: Mild pulmonary edema. Patchy bibasilar opacities. Small bilateral pleural effusions. No pneumothorax. HEART AND MEDIASTINUM: Persistent cardiomegaly. Aortic atherosclerosis. BONES AND SOFT TISSUES: No  acute osseous abnormality. IMPRESSION: 1. Mild pulmonary edema with small bilateral pleural effusions and patchy bibasilar atelectasis or aspiration. 2. Cardiomegaly with aortic atherosclerosis. Electronically signed by: Greig Pique MD 10/27/2024 03:11 PM EST RP Workstation: [id number]      Consults: Case discussed with Dr. Dena with the hospitalist service  Reassessment and Plan:   Given this patient's new oxygen  requirement, findings on lab evaluation of elevated proBNP along with x-ray findings that show newfound pleural effusions and bibasilar atelectasis that is likely secondary to CHF, consulted the hospitalist service for admission of this patient for new onset CHF/cardiorenal syndrome.  Patient was also given 40 mg Lasix .  Hospitalist team accepts this patient for admission for the same.  This was discussed thoroughly with the patient and her husband who understand need for admission and agree.       Final diagnoses:  Acute congestive heart failure, unspecified heart failure type (HCC)  AKI (acute kidney injury)  Hypoxia    ED Discharge Orders     None          Myriam Dorn BROCKS, [place] 10/27/24 1810    Yolande Lamar BROCKS, MD 10/31/24 1102

## 2024-10-27 NOTE — ED Notes (Signed)
 Pt provided chips, crackers, and cranberry juice.

## 2024-10-28 ENCOUNTER — Encounter (HOSPITAL_COMMUNITY): Payer: Self-pay | Admitting: Internal Medicine

## 2024-10-28 ENCOUNTER — Inpatient Hospital Stay (HOSPITAL_COMMUNITY)

## 2024-10-28 DIAGNOSIS — E1129 Type 2 diabetes mellitus with other diabetic kidney complication: Secondary | ICD-10-CM | POA: Diagnosis present

## 2024-10-28 DIAGNOSIS — Z885 Allergy status to narcotic agent status: Secondary | ICD-10-CM | POA: Diagnosis not present

## 2024-10-28 DIAGNOSIS — I351 Nonrheumatic aortic (valve) insufficiency: Secondary | ICD-10-CM | POA: Diagnosis present

## 2024-10-28 DIAGNOSIS — Z7901 Long term (current) use of anticoagulants: Secondary | ICD-10-CM | POA: Diagnosis not present

## 2024-10-28 DIAGNOSIS — M7989 Other specified soft tissue disorders: Secondary | ICD-10-CM | POA: Diagnosis not present

## 2024-10-28 DIAGNOSIS — E1122 Type 2 diabetes mellitus with diabetic chronic kidney disease: Secondary | ICD-10-CM | POA: Diagnosis present

## 2024-10-28 DIAGNOSIS — I130 Hypertensive heart and chronic kidney disease with heart failure and stage 1 through stage 4 chronic kidney disease, or unspecified chronic kidney disease: Secondary | ICD-10-CM | POA: Diagnosis present

## 2024-10-28 DIAGNOSIS — D631 Anemia in chronic kidney disease: Secondary | ICD-10-CM | POA: Diagnosis present

## 2024-10-28 DIAGNOSIS — N179 Acute kidney failure, unspecified: Secondary | ICD-10-CM | POA: Diagnosis not present

## 2024-10-28 DIAGNOSIS — N184 Chronic kidney disease, stage 4 (severe): Secondary | ICD-10-CM | POA: Diagnosis present

## 2024-10-28 DIAGNOSIS — I495 Sick sinus syndrome: Secondary | ICD-10-CM | POA: Diagnosis present

## 2024-10-28 DIAGNOSIS — Z881 Allergy status to other antibiotic agents status: Secondary | ICD-10-CM | POA: Diagnosis not present

## 2024-10-28 DIAGNOSIS — I5031 Acute diastolic (congestive) heart failure: Secondary | ICD-10-CM | POA: Diagnosis not present

## 2024-10-28 DIAGNOSIS — I700 Atherosclerosis of aorta: Secondary | ICD-10-CM | POA: Diagnosis present

## 2024-10-28 DIAGNOSIS — E876 Hypokalemia: Secondary | ICD-10-CM | POA: Diagnosis present

## 2024-10-28 DIAGNOSIS — D638 Anemia in other chronic diseases classified elsewhere: Secondary | ICD-10-CM | POA: Diagnosis present

## 2024-10-28 DIAGNOSIS — I272 Pulmonary hypertension, unspecified: Secondary | ICD-10-CM | POA: Diagnosis present

## 2024-10-28 DIAGNOSIS — E7800 Pure hypercholesterolemia, unspecified: Secondary | ICD-10-CM | POA: Diagnosis present

## 2024-10-28 DIAGNOSIS — I5033 Acute on chronic diastolic (congestive) heart failure: Secondary | ICD-10-CM | POA: Diagnosis present

## 2024-10-28 DIAGNOSIS — Z883 Allergy status to other anti-infective agents status: Secondary | ICD-10-CM | POA: Diagnosis not present

## 2024-10-28 DIAGNOSIS — Z950 Presence of cardiac pacemaker: Secondary | ICD-10-CM | POA: Diagnosis not present

## 2024-10-28 DIAGNOSIS — I480 Paroxysmal atrial fibrillation: Secondary | ICD-10-CM | POA: Diagnosis present

## 2024-10-28 DIAGNOSIS — R0902 Hypoxemia: Secondary | ICD-10-CM | POA: Diagnosis present

## 2024-10-28 DIAGNOSIS — Z79899 Other long term (current) drug therapy: Secondary | ICD-10-CM | POA: Diagnosis not present

## 2024-10-28 DIAGNOSIS — Z87891 Personal history of nicotine dependence: Secondary | ICD-10-CM | POA: Diagnosis not present

## 2024-10-28 DIAGNOSIS — Z7982 Long term (current) use of aspirin: Secondary | ICD-10-CM | POA: Diagnosis not present

## 2024-10-28 DIAGNOSIS — J9601 Acute respiratory failure with hypoxia: Secondary | ICD-10-CM | POA: Diagnosis present

## 2024-10-28 DIAGNOSIS — F32A Depression, unspecified: Secondary | ICD-10-CM | POA: Diagnosis present

## 2024-10-28 DIAGNOSIS — Z7984 Long term (current) use of oral hypoglycemic drugs: Secondary | ICD-10-CM | POA: Diagnosis not present

## 2024-10-28 DIAGNOSIS — R54 Age-related physical debility: Secondary | ICD-10-CM | POA: Diagnosis present

## 2024-10-28 LAB — HEMOGLOBIN A1C
Hgb A1c MFr Bld: 6.1 % — ABNORMAL HIGH (ref 4.8–5.6)
Mean Plasma Glucose: 128.37 mg/dL

## 2024-10-28 LAB — URINALYSIS, ROUTINE W REFLEX MICROSCOPIC
Bilirubin Urine: NEGATIVE
Glucose, UA: NEGATIVE mg/dL
Ketones, ur: NEGATIVE mg/dL
Leukocytes,Ua: NEGATIVE
Nitrite: NEGATIVE
Protein, ur: NEGATIVE mg/dL
Specific Gravity, Urine: 1.005 (ref 1.005–1.030)
pH: 5 (ref 5.0–8.0)

## 2024-10-28 LAB — GLUCOSE, CAPILLARY: Glucose-Capillary: 132 mg/dL — ABNORMAL HIGH (ref 70–99)

## 2024-10-28 LAB — CREATININE, URINE, RANDOM: Creatinine, Urine: 26 mg/dL

## 2024-10-28 MED ORDER — POLYETHYLENE GLYCOL 3350 17 G PO PACK
17.0000 g | PACK | Freq: Every day | ORAL | Status: DC | PRN
  Administered 2024-10-29: 17 g via ORAL
  Filled 2024-10-28: qty 1

## 2024-10-28 MED ORDER — VITAMIN D 25 MCG (1000 UNIT) PO TABS
1000.0000 [IU] | ORAL_TABLET | Freq: Every day | ORAL | Status: DC
  Administered 2024-10-29 – 2024-10-30 (×2): 1000 [IU] via ORAL
  Filled 2024-10-28 (×2): qty 1

## 2024-10-28 MED ORDER — INSULIN ASPART 100 UNIT/ML IJ SOLN
0.0000 [IU] | Freq: Three times a day (TID) | INTRAMUSCULAR | Status: DC
  Administered 2024-10-28 – 2024-10-30 (×3): 1 [IU] via SUBCUTANEOUS
  Filled 2024-10-28 (×3): qty 1

## 2024-10-28 MED ORDER — ROSUVASTATIN CALCIUM 20 MG PO TABS
40.0000 mg | ORAL_TABLET | Freq: Every day | ORAL | Status: DC
  Administered 2024-10-29 – 2024-10-30 (×2): 40 mg via ORAL
  Filled 2024-10-28 (×2): qty 2

## 2024-10-28 MED ORDER — FLUTICASONE PROPIONATE 50 MCG/ACT NA SUSP: 1.0000 | NASAL | Status: DC | PRN

## 2024-10-28 MED ORDER — ACETAMINOPHEN 325 MG PO TABS
650.0000 mg | ORAL_TABLET | Freq: Four times a day (QID) | ORAL | Status: DC | PRN
  Administered 2024-10-30: 13:00:00 650 mg via ORAL
  Filled 2024-10-28: qty 2

## 2024-10-28 MED ORDER — LORATADINE 10 MG PO TABS
10.0000 mg | ORAL_TABLET | Freq: Every day | ORAL | Status: DC
  Administered 2024-10-29 – 2024-10-30 (×2): 10 mg via ORAL
  Filled 2024-10-28 (×2): qty 1

## 2024-10-28 MED ORDER — OLOPATADINE HCL 0.1 % OP SOLN
1.0000 [drp] | Freq: Two times a day (BID) | OPHTHALMIC | Status: DC
  Administered 2024-10-28 – 2024-10-30 (×4): 1 [drp] via OPHTHALMIC
  Filled 2024-10-28: qty 5

## 2024-10-28 MED ORDER — FUROSEMIDE 10 MG/ML IJ SOLN
40.0000 mg | Freq: Two times a day (BID) | INTRAMUSCULAR | Status: DC
  Administered 2024-10-28 – 2024-10-30 (×4): 40 mg via INTRAVENOUS
  Filled 2024-10-28 (×4): qty 4

## 2024-10-28 MED ORDER — BISACODYL 5 MG PO TBEC: 5.0000 mg | DELAYED_RELEASE_TABLET | Freq: Every day | ORAL | Status: DC | PRN

## 2024-10-28 MED ORDER — APIXABAN 2.5 MG PO TABS
2.5000 mg | ORAL_TABLET | Freq: Two times a day (BID) | ORAL | Status: DC
  Administered 2024-10-28 – 2024-10-30 (×5): 2.5 mg via ORAL
  Filled 2024-10-28 (×5): qty 1

## 2024-10-28 MED ORDER — ACETAMINOPHEN 650 MG RE SUPP: 650.0000 mg | Freq: Four times a day (QID) | RECTAL | Status: DC | PRN

## 2024-10-28 MED ORDER — PANTOPRAZOLE SODIUM 40 MG PO TBEC
40.0000 mg | DELAYED_RELEASE_TABLET | Freq: Two times a day (BID) | ORAL | Status: DC
  Administered 2024-10-29 – 2024-10-30 (×3): 40 mg via ORAL
  Filled 2024-10-28 (×4): qty 1

## 2024-10-28 NOTE — Assessment & Plan Note (Signed)
 Sliding scale NovoLog   Confirm home regimen pending med history verification A1c pending

## 2024-10-28 NOTE — ED Notes (Signed)
 NS called Bed Placement per ED Charge, spoke with [facility] about pt 15 hour wait for bed assignment. Gwynneth stated that there are no beds, are waiting for discharges on the tele unit.

## 2024-10-28 NOTE — ED Notes (Signed)
 Pt has output with lasix  at this time.  Same is in use of the purewick device.  Will continue to monitor.  Pt notes relief in her [facility] with urine output increase

## 2024-10-28 NOTE — Progress Notes (Signed)
 Virtual admission for CHF exacerbation  Chart reviewed. Patient seen and examined at bedside. Reports feeling slightly better with improvement in shortness of breath. Reports she is unsure if she has a lot of edema. On examination, bilateral legs tender with tense 3+ pitting edema. No wheezing noted on examination. - at home on torsemide  20mg  daily - Continue with IV lasix  40mg  BID - Hold off of home metoprolol , amlodipine, hydralazine  to allos BP room to diurese. Reintroduce if BP trending high - TTE pending - ordered venous US  legs to rule out acute DVT (patient compliant with eliquis  but didn't take the med at all yesterday since she was in the ED) - monitor I/Os, daily weights  Possible acute of chronic anemia - at home on slow release iron tablets - will check iron studies - if iron-deficient, consider IV iron given presents with CHF exacerbation  AKI on CKD - diuresis as above - will check bladder scan x1 to rule out urinary retention with overflow incontinence - will get UA, Ulytes, renal US  - cont external foley for now but encourage ambulation - monitor I/Os, daily weights, electrolytes, renal function

## 2024-10-28 NOTE — ED Notes (Signed)
 [facility] called for transport, spoke with Zachary

## 2024-10-28 NOTE — Assessment & Plan Note (Addendum)
 Prior Echo Feb 2024 at [facility] showed EF 55-60%, mild AR, mild-mod MR, mild TR. Patient presenting with progressive dyspnea worse with exertion, new oxygen  requirement, chest x-ray showing mild pulmonary edema and bilateral pleural effusions, elevated proBNP.  All clinically consistent with decompensated CHF. Excellent diuresis with IV Lasix  in the ED. -- Admit to telemetry for further evaluation --Continue diuresis with IV Lasix  40 mg twice daily --Consult cardiology, appreciate recommendations --Echocardiogram ordered --Monitor renal function and electrolytes --Strict I/O's and daily weights

## 2024-10-28 NOTE — Plan of Care (Signed)
°  Problem: Education: Goal: Knowledge of General Education information will improve Description: Including pain rating scale, medication(s)/side effects and non-pharmacologic comfort measures Outcome: Progressing   Problem: Health Behavior/Discharge Planning: Goal: Ability to manage health-related needs will improve Outcome: Progressing   Problem: Clinical Measurements: Goal: Ability to maintain clinical measurements within normal limits will improve Outcome: Progressing   Problem: Clinical Measurements: Goal: Will remain free from infection Outcome: Progressing   Problem: Clinical Measurements: Goal: Diagnostic test results will improve Outcome: Progressing   Problem: Clinical Measurements: Goal: Respiratory complications will improve Outcome: Progressing   Problem: Clinical Measurements: Goal: Cardiovascular complication will be avoided Outcome: Progressing   Problem: Pain Managment: Goal: General experience of comfort will improve and/or be controlled Outcome: Progressing   Problem: Education: Goal: Ability to demonstrate management of disease process will improve Outcome: Progressing   Problem: Activity: Goal: Capacity to carry out activities will improve Outcome: Progressing   Problem: Coping: Goal: Ability to adjust to condition or change in health will improve Outcome: Progressing

## 2024-10-28 NOTE — Consult Note (Signed)
 As below, patient seen and examined.  Briefly she is an 83 year old female with past medical history of paroxysmal atrial fibrillation, history of pacemaker placement, heart failure preserved ejection fraction, diabetes mellitus, hypertension, hyperlipidemia, chronic kidney disease for evaluation of acute on chronic diastolic congestive heart failure.  Last echocardiogram February 2024 at [facility] showed normal LV function, mild aortic insufficiency, mild to moderate mitral regurgitation, enlarged left atrium.  Nuclear study August 2025 showed no ischemia and normal LV function though read as small prior infarcts.  Patient states that over the past 1 to 2 weeks she has had worsening dyspnea on exertion.  No orthopnea, PND, chest pain, fevers, chills.  Minimal pedal edema.  She has gained approximately 3 pounds.  Had URI last week.  Admitted and cardiology asked to evaluate.  Creatinine 2.53, proBNP 9493, hemoglobin 9.4.  Electrocardiogram shows AV pacing.  Chest x-ray showed mild pulmonary edema with small bilateral pleural effusions.  1 acute on chronic diastolic congestive heart failure-patient presented with CHF symptoms.  Treated with Lasix  in the emergency room and her symptoms are improving.  Treat with Lasix  40 mg IV twice daily.  Follow renal function closely.  Repeat echocardiogram.  Will consider SGLT2 inhibitor at discharge.  2 chronic stage IIIb kidney disease-follow renal function closely with diuresis.  3 hypertension-patient's blood pressure will be monitored and regimen adjusted as needed.  4 history of atrial fibrillation-continue carvedilol and apixaban .  Patient in AV paced rhythm.  5 status post pacemaker  6 MR-mild to moderate on most recent echo. Will repeat  [name], MD  Cardiology Consultation   Patient ID: Angie Barrett MRN: [id number]; DOB: 1940-11-27  Admit date: 10/27/2024 Date of Consult: 10/28/2024  PCP:  Angie Jon CROME, MD   [facility]  Providers Cardiologist:  [facility]     Patient Profile: Angie Barrett is a 83 y.o. 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 Fausto.  History of Present Illness: Angie Barrett follows with [facility]. She denies prior MI and PCI. She has CKD IV.   She had a recent nuclear stress test 06/2024 with [facility] that did not show significant ischemia, but possible small prior infarcts. Normal LV function was noted.    Two months ago she was diagnosed with low iron consistent with anemia of chronic kidney disease. This week, she has experienced more exertional fatigue and DOE. She typically can walk without symptoms. She reports 3-4 lbs in the last week. She denies CP and fever. She had a cold last week with productive cough. She denies increased sodium intake over the holidays. She reports pain under her left breast last month x 1 episode.   Workup so far significant for sCr 2.53, appears above baseline of 1.8-1.9.  Hb 9.4 (11.0 in 01/2024) proBNP 9500 CXR consistent with mild pulmonary edema, small B pleural effusions.  She has received  40 mg IV lasix  x 1 dose with improvement in her SOB.    Past Medical History:  Diagnosis Date   A-fib (HCC)    Angioedema    Bradycardia    CKD (chronic kidney disease)    Diabetes mellitus without complication (HCC)    High cholesterol    Hypertension    Pain in both feet    Pain in both lower legs  Rhinorrhea    Vitreous floaters     Past Surgical History:  Procedure Laterality Date   LEG SURGERY     PACEMAKER IMPLANT       Home Medications:  Prior to Admission medications  Medication Sig Start Date End Date Taking? Authorizing Provider  acetaminophen  (TYLENOL ) 500 MG tablet Take 2 tablets (1,000 mg total) by mouth every 6 (six) hours as needed. 05/22/18    Armenta Canning, MD  aspirin EC 81 MG tablet Take 81 mg by mouth daily. Swallow whole. Patient not taking: Reported on 04/05/2024    [provider]  benzonatate  (TESSALON ) 100 MG capsule Take 1 capsule (100 mg total) by mouth 3 (three) times daily as needed. Patient not taking: Reported on 04/05/2024 01/19/24   Silver Fell A, PA  calcitRIOL (ROCALTROL) 0.5 MCG capsule Take 0.5 mcg by mouth daily.    [provider]  calcium  carbonate (OSCAL) 1500 (600 Ca) MG TABS tablet Take 600 mg of elemental calcium  by mouth 2 (two) times daily with a meal.    [provider]  carvedilol (COREG) 6.25 MG tablet Take by mouth. Patient not taking: Reported on 04/05/2024 10/04/19   [provider]  cholecalciferol  (VITAMIN D3) 25 MCG (1000 UNIT) tablet Take 2,000 Units by mouth daily.    [provider]  conjugated estrogens (PREMARIN) vaginal cream Place 1 Applicatorful vaginally 2 (two) times a week.    [provider]  Continuous Glucose Sensor (DEXCOM G7 SENSOR) MISC by Does not apply route.    [provider]  ELIQUIS  2.5 MG TABS tablet Take 2.5 mg by mouth 2 (two) times daily. 11/03/21   [provider]  EPINEPHrine  0.3 mg/0.3 mL IJ SOAJ injection Inject 0.3 mg into the muscle as needed for anaphylaxis. 08/27/20   Watson, Julia, MD  felodipine (PLENDIL) 10 MG 24 hr tablet Take by mouth. 10/07/22   [provider]  fluticasone  (FLONASE ) 50 MCG/ACT nasal spray Place 1 spray into both nostrils daily. 04/20/23   Lorin Norris, MD  ipratropium (ATROVENT ) 0.06 % nasal spray USE 2 SPRAYS IN EACH NOSTRIL FOUR TIMES DAILY AS NEEDED FOR NASAL CONGESTION OR RUNNY NOSE 10/10/24   Lorin Norris, MD  levocetirizine (XYZAL ) 5 MG tablet Take 1 tablet (5 mg total) by mouth every evening. 09/29/23   Lorin Norris, MD  linagliptin (TRADJENTA) 5 MG TABS tablet Take 5 mg by mouth daily.    [provider]  metoprolol  succinate  (TOPROL -XL) 25 MG 24 hr tablet Take 25 mg by mouth daily.    [provider]  montelukast  (SINGULAIR ) 10 MG tablet TAKE 1 TABLET(10 MG) BY MOUTH AT BEDTIME 05/05/24   Lorin Norris, MD  NIFEdipine  (PROCARDIA  XL/NIFEDICAL-XL) 90 MG 24 hr tablet Take 90 mg by mouth daily. Patient not taking: Reported on 04/05/2024    [provider]  Olopatadine  HCl 0.2 % SOLN Apply 1 drop to eye daily. 09/29/23   Lorin Norris, MD  omeprazole (PRILOSEC) 40 MG capsule Take 40 mg by mouth daily.    [provider]  pioglitazone (ACTOS) 15 MG tablet Take 15 mg by mouth daily.    [provider]  potassium chloride  SA (KLOR-CON  M) 20 MEQ tablet Take 1 tablet (20 mEq total) by mouth daily for 7 days. 02/28/22 03/07/22  Conklin, Erica R, PA-C  rosuvastatin  (CRESTOR ) 40 MG tablet Take 40 mg by mouth daily.    [provider]  torsemide  (DEMADEX ) 20 MG tablet Take 20 mg by mouth  daily.    [provider]  triamcinolone  (NASACORT ) 55 MCG/ACT AERO nasal inhaler Place 2 sprays into the nose daily. 01/19/24   Silver Wonda LABOR, PA    Scheduled Meds:  apixaban   2.5 mg Oral BID   Continuous Infusions:  PRN Meds:   Allergies:   Allergies[1]  Social History:   Social History   Socioeconomic History   Marital status: Married    Spouse name: Not on file   Number of children: Not on file   Years of education: Not on file   Highest education level: Not on file  Occupational History   Not on file  Tobacco Use   Smoking status: Unknown    Passive exposure: Never   Smokeless tobacco: Never  Vaping Use   Vaping status: Never Used  Substance and Sexual Activity   Alcohol use: Not Currently   Drug use: Never   Sexual activity: Not Currently  Other Topics Concern   Not on file  Social History Narrative   Not on file   Social Drivers of Health   Tobacco Use: Unknown (10/27/2024)   Patient History    Smoking Tobacco Use: Unknown    Smokeless Tobacco Use:  Never    Passive Exposure: Never  Recent Concern: Tobacco Use - Medium Risk (08/28/2024)   Received from [facility]   Patient History    Smoking Tobacco Use: Former    Smokeless Tobacco Use: Never    Passive Exposure: Not on file  Financial Resource Strain: Low Risk (06/01/2024)   Received from [facility]   Overall Financial Resource Strain (CARDIA)    How hard is it for you to pay for the very basics like food, housing, medical care, and heating?: Not hard at all  Food Insecurity: No Food Insecurity (06/01/2024)   Received from [facility]   Epic    Within the past 12 months, you worried that your food would run out before you got the money to buy more.: Never true    Within the past 12 months, the food you bought just didn't last and you didn't have money to get more.: Never true  Transportation Needs: No Transportation Needs (06/01/2024)   Received from [facility]    In the past 12 months, has lack of transportation kept you from medical appointments or from getting medications?: No    In the past 12 months, has lack of transportation kept you from meetings, work, or from getting things needed for daily living?: No  Physical Activity: Insufficiently Active (06/01/2024)   Received from [facility]   Exercise Vital Sign    On average, how many days per week do you engage in moderate to strenuous exercise (like a brisk walk)?: 3 days    On average, how many minutes do you engage in exercise at this level?: 30 min  Stress: No Stress Concern Present (06/01/2024)   Received from [facility] - Occupational Stress Questionnaire    Do you feel stress - tense, restless, nervous, or anxious, or unable to sleep at night because your mind is troubled all the time - these days?: Not at all  Social Connections: Socially Integrated (06/01/2024)   Received from [facility]   Social Network    How would you rate your social network  (family, work, friends)?: Good participation with social networks  Intimate Partner Violence: Not At Risk (06/01/2024)   Received from [facility]  HITS    Over the last 12 months how often did your partner physically hurt you?: Never    Over the last 12 months how often did your partner insult you or talk down to you?: Never    Over the last 12 months how often did your partner threaten you with physical harm?: Never    Over the last 12 months how often did your partner scream or curse at you?: Never  Depression (PHQ2-9): Low Risk (07/11/2024)   Depression (PHQ2-9)    PHQ-2 Score: 0  Alcohol Screen: Not on file  Housing: Low Risk (06/01/2024)   Received from [facility]    In the last 12 months, was there a time when you were not able to pay the mortgage or rent on time?: No    In the past 12 months, how many times have you moved where you were living?: 1    At any time in the past 12 months, were you homeless or living in a shelter (including now)?: No  Utilities: Not At Risk (06/01/2024)   Received from [facility]    In the past 12 months has the electric, gas, oil, or water company threatened to shut off services in your home?: No  Health Literacy: Not on file    Family History:   History reviewed. No pertinent family history.   ROS:  Please see the history of present illness.   All other ROS reviewed and negative.     Physical Exam/Data: Vitals:   10/28/24 1000 10/28/24 1030 10/28/24 1209 10/28/24 1323  BP: 131/67 139/61  (!) 155/61  Pulse: 69 70  69  Resp: 15 17  18   Temp:   98.8 F (37.1 C) 98.7 F (37.1 C)  TempSrc:   Oral Oral  SpO2: 94% 95%  95%    Intake/Output Summary (Last 24 hours) at 10/28/2024 1413 Last data filed at 10/28/2024 1036 Gross per 24 hour  Intake 120 ml  Output 1100 ml  Net -980 ml      10/09/2024    3:18 PM 04/05/2024    3:10 PM 01/19/2024   11:58 AM  Last 3 Weights  Weight (lbs) 164 lb 12.8 oz 163 lb 158 lb 11.7 oz   Weight (kg) 74.753 kg 73.936 kg 72 kg     There is no height or weight on file to calculate BMI.  General:  Well nourished, well developed, in no acute distress HEENT: normal Neck: no JVD Vascular: No carotid bruits; Distal pulses 2+ bilaterally Cardiac:  normal S1, S2; RRR; no murmur  Lungs:  clear to auscultation bilaterally, no wheezing, rhonchi or rales  Abd: soft, nontender, no hepatomegaly  Ext: no edema Musculoskeletal:  No deformities, BUE and BLE strength normal and equal Skin: warm and dry  Neuro:  CNs 2-12 intact, no focal abnormalities noted Psych:  Normal affect   EKG:  The EKG was personally reviewed and demonstrates:  V paced rhythm Telemetry:  Telemetry was personally reviewed and demonstrates:  A-V paced rhythm  Relevant CV Studies:  Echo pending  Laboratory Data:  Chemistry Recent Labs  Lab 10/27/24 1432  NA 138  K 3.7  CL 99  CO2 24  GLUCOSE 142*  BUN 35*  CREATININE 2.53*  CALCIUM  9.7  GFRNONAA 18*  ANIONGAP 15     Hematology Recent Labs  Lab 10/27/24 1432  WBC 5.2  RBC 3.03*  HGB 9.4*  HCT 27.9*  MCV 92.1  MCH 31.0  MCHC 33.7  RDW 15.2  PLT 239    BNP Recent Labs  Lab 10/27/24 1432  PROBNP 9,493.0*      Radiology/Studies:  DG Chest 2 View Result Date: 10/27/2024 EXAM: 2 VIEW(S) XRAY OF THE CHEST 10/27/2024 02:40:00 PM COMPARISON: 01/19/2024 CLINICAL HISTORY: SOB FINDINGS: LINES, TUBES AND DEVICES: Left subclavian approach dual lead cardiac rhythm maintenance device stable in position. LUNGS AND PLEURA: Mild pulmonary edema. Patchy bibasilar opacities. Small bilateral pleural effusions. No pneumothorax. HEART AND MEDIASTINUM: Persistent cardiomegaly. Aortic atherosclerosis. BONES AND SOFT TISSUES: No acute osseous abnormality. IMPRESSION: 1. Mild pulmonary edema with small bilateral pleural effusions and patchy bibasilar atelectasis or aspiration. 2. Cardiomegaly with aortic atherosclerosis. 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 - appears paced rhythm on telemetry   Aortic insufficiency - echo pending   Hypertension PTA: 25 mg hydralazine  TID, 20 mg torsemide  daily, 20 mEq K daily, 5 mg felodipine BID, 10 mg amlodipine, 40 mg crestor  - will discontinue felodipine in the setting of amlodipine - hold PO torsemide  and potassium fo rnow - continue 25 mg hydralazine  BID   Hyperlipidemia - has been on 40 mg crestor  - given renal insufficiency, would reduce this to 10 mg   Atypical chest pain - one episode last month - recent reassuring nuclear stress test in 06/2024     Risk Assessment/Risk Scores:    [place]  Heart Association (NYHA) Functional Class NYHA Class IV  CHA2DS2-VASc Score = 6   This indicates a 9.7% annual risk of stroke. The patient's score is based upon: CHF History: 1 HTN History: 1 Diabetes History: 1 Stroke History: 0 Vascular Disease History: 0 Age Score: 2 Gender Score: 1      For questions or updates, please contact [facility] Please consult [email or web address] for contact info under      Signed, Jon Nat Hails, PA  10/28/2024 2:13 PM     [1]  Allergies Allergen Reactions   Amoxicillin-Pot Clavulanate Swelling   Ciprofloxacin Swelling   Clotrimazole Swelling    Could not breathe. Could not breathe.    Nystatin Itching   Olmesartan Other (See Comments) and Swelling   Hydrochlorothiazide Other  (See Comments)    hyponatremia    Metformin Other (See Comments)    Contraindication due to Renal Insufficiency Other reaction(s): CONTRAINDICATED DUE TO RENAL INSUFF    Other Other (See Comments)    Other reaction(s): RENAL INSUFF    Contraindicated due to Renal Insufficiency   Parathyroid Hormone (Recomb) Swelling   Spironolactone Other (See Comments)    hyponatremia   Telithromycin Other (See Comments)    Unknown Reaction per Patient    Teriparatide  Swelling   Tramadol Other (See Comments)    Urinary retention   Ace Inhibitors

## 2024-10-28 NOTE — Assessment & Plan Note (Signed)
 Status post pacemaker. Sees Cardiology at [facility].

## 2024-10-28 NOTE — H&P (Signed)
 Telemedicine History and Physical    Patient: Angie Barrett FMW:[id number] DOB: 1941/10/25 DOA: 10/27/2024 DOS: the patient was seen and examined on 10/28/2024 PCP: Abran Jon CROME, MD   Referring Provider: Dorn Dec, PA Telemedicine Provider: Burnard Cunning, DO Patient Location: [facility] ED Referring Diagnosis: CHF Patient Name and DOB verified: Angie Barrett, July 17, 1941 Patient consented to Telemedicine Evaluation:Yes RN virtual assistant: Quintin Abernethy, RN Video encounter time and date: 10/28/24 10:40 AM   Patient coming from: Home  Chief Complaint:  Chief Complaint  Patient presents with   Shortness of Breath   Abnormal Lab   HPI: Angie Barrett is a 83 y.o. female with medical history significant of CKD stage IV, type 2 diabetes, HTN, HLD, A-fib who presented to [facility] ED for evaluation of worsening shortness of breath and fatigue.  Patient reports feeling extremely tired and fatigued, generally weak and having trouble with breathing.  She reports this started around Tuesday and has gotten progressively worse since that time.  Reports shortness of breath is worse when she is up moving and walking.  Denies orthopnea or PND.  She did not notice any leg swelling.  She denies being on home oxygen  at baseline.  Denies any prior episodes of similar symptoms.  She reports no recent fevers chills, sore throat cough or congestion, myalgias, abdominal pain or nausea vomiting.  She reports recent constipation, no diarrhea.  No UTI symptoms.  She follows with cardiology at [facility].  ED course: Initial vitals --temp 98.1 F, HR 81, RR 20, BP 144/48, 93% SpO2 later 89%, placed on supplemental O2 by nasal cannula and this morning is on 4 L/min/with SpO2 ranging 94-97%. Labs obtained including BMP and CBC were notable for glucose 142, BUN 35, creatinine 2.53 (compared to 1.95, 1.81 both in March), hemoglobin 9.4 (compared to 11.0 in March). proBNP elevated 9493. Imaging --two-view chest x-ray  showed mild pulmonary edema with small bilateral pleural effusions and patchy bibasilar atelectasis (versus aspiration).  Cardiomegaly with aortic atherosclerosis. EKG normal sinus rhythm at 73 bpm, no ST elevation or acute ischemic changes.  Patient was treated in the ED with 40 mg IV Lasix  with excellent urine output thereafter.  Patient is being admitted to the hospital with cardiology consulted for further evaluation and management of likely acute on chronic HFpEF complicated by acute respiratory failure with hypoxia as outlined in detail below.    Review of Systems: As mentioned in the history of present illness. All other systems reviewed and are negative.   Past Medical History:  Diagnosis Date   A-fib (HCC)    Angioedema    Bradycardia    CKD (chronic kidney disease)    Diabetes mellitus without complication (HCC)    High cholesterol    Hypertension    Pain in both feet    Pain in both lower legs    Rhinorrhea    Vitreous floaters    Past Surgical History:  Procedure Laterality Date   LEG SURGERY     PACEMAKER IMPLANT     Social History:  reports that she has an unknown smoking status. She has never been exposed to tobacco smoke. She has never used smokeless tobacco. She reports that she does not currently use alcohol. She reports that she does not use drugs.  Allergies[1]  History reviewed. No pertinent family history.  Prior to Admission medications  Medication Sig Start Date End Date Taking? Authorizing Provider  acetaminophen  (TYLENOL ) 500 MG tablet Take 2 tablets (1,000 mg total) by mouth every  6 (six) hours as needed. 05/22/18   Armenta Canning, MD  aspirin EC 81 MG tablet Take 81 mg by mouth daily. Swallow whole. Patient not taking: Reported on 04/05/2024    [provider]  benzonatate  (TESSALON ) 100 MG capsule Take 1 capsule (100 mg total) by mouth 3 (three) times daily as needed. Patient not taking: Reported on 04/05/2024 01/19/24   Silver Fell A,  PA  calcitRIOL (ROCALTROL) 0.5 MCG capsule Take 0.5 mcg by mouth daily.    [provider]  calcium  carbonate (OSCAL) 1500 (600 Ca) MG TABS tablet Take 600 mg of elemental calcium  by mouth 2 (two) times daily with a meal.    [provider]  carvedilol (COREG) 6.25 MG tablet Take by mouth. Patient not taking: Reported on 04/05/2024 10/04/19   [provider]  cholecalciferol  (VITAMIN D3) 25 MCG (1000 UNIT) tablet Take 2,000 Units by mouth daily.    [provider]  conjugated estrogens (PREMARIN) vaginal cream Place 1 Applicatorful vaginally 2 (two) times a week.    [provider]  Continuous Glucose Sensor (DEXCOM G7 SENSOR) MISC by Does not apply route.    [provider]  ELIQUIS  2.5 MG TABS tablet Take 2.5 mg by mouth 2 (two) times daily. 11/03/21   [provider]  EPINEPHrine  0.3 mg/0.3 mL IJ SOAJ injection Inject 0.3 mg into the muscle as needed for anaphylaxis. 08/27/20   Watson, Julia, MD  felodipine (PLENDIL) 10 MG 24 hr tablet Take by mouth. 10/07/22   [provider]  fluticasone  (FLONASE ) 50 MCG/ACT nasal spray Place 1 spray into both nostrils daily. 04/20/23   Lorin Norris, MD  ipratropium (ATROVENT ) 0.06 % nasal spray USE 2 SPRAYS IN EACH NOSTRIL FOUR TIMES DAILY AS NEEDED FOR NASAL CONGESTION OR RUNNY NOSE 10/10/24   Lorin Norris, MD  levocetirizine (XYZAL ) 5 MG tablet Take 1 tablet (5 mg total) by mouth every evening. 09/29/23   Lorin Norris, MD  linagliptin (TRADJENTA) 5 MG TABS tablet Take 5 mg by mouth daily.    [provider]  metoprolol  succinate (TOPROL -XL) 25 MG 24 hr tablet Take 25 mg by mouth daily.    [provider]  montelukast  (SINGULAIR ) 10 MG tablet TAKE 1 TABLET(10 MG) BY MOUTH AT BEDTIME 05/05/24   Lorin Norris, MD  NIFEdipine  (PROCARDIA  XL/NIFEDICAL-XL) 90 MG 24 hr tablet Take 90 mg by mouth daily. Patient not taking: Reported on 04/05/2024    [provider]  Olopatadine  HCl 0.2 % SOLN Apply 1 drop to eye daily. 09/29/23   Lorin Norris, MD  omeprazole (PRILOSEC) 40 MG capsule Take 40 mg by mouth daily.    [provider]  pioglitazone (ACTOS) 15 MG tablet Take 15 mg by mouth daily.    [provider]  potassium chloride  SA (KLOR-CON  M) 20 MEQ tablet Take 1 tablet (20 mEq total) by mouth daily for 7 days. 02/28/22 03/07/22  Conklin, Erica R, PA-C  rosuvastatin  (CRESTOR ) 40 MG tablet Take 40 mg by mouth daily.    [provider]  torsemide  (DEMADEX ) 20 MG tablet Take 20 mg by mouth daily.    [provider]  triamcinolone  (NASACORT ) 55 MCG/ACT AERO nasal inhaler Place 2 sprays into the nose daily. 01/19/24   Silver Fell LABOR, PA    Physical Exam: Vitals:   10/28/24 0630 10/28/24 1000 10/28/24 1030 10/28/24 1209  BP: (!) 146/64 131/67 139/61   Pulse: 69 69 70   Resp: 16 15 17  Temp: 98.1 F (36.7 C)   98.8 F (37.1 C)  TempSrc: Oral   Oral  SpO2: 97% 94% 95%    Bedside physical exam was performed by RN listed above. Below exam findings are based on their in person physical exam findings and my observations during virtual encounter.  General exam: awake, alert, no acute distress HEENT: Voice clear, hearing grossly normal  Respiratory system: CTAB, no wheezes, rales or rhonchi, normal respiratory effort at rest on 4 L/min  O2. Cardiovascular system: normal S1/S2, RRR, prior lower extremity edema appears nearly resolved Gastrointestinal system: soft, NT, ND, no HSM felt, +bowel sounds. Central nervous system: A&O x 3. no gross focal neurologic deficits, normal speech Extremities: moves all Skin: RN reports skin dry normal temperature and no color changes on the distal lower extremities Psychiatry: normal mood, congruent affect, judgement and insight appear normal    Data Reviewed:   Labs & diagnostic studiesas reviewed in detail above  Assessment and Plan: * Acute on chronic  heart failure with preserved ejection fraction (HFpEF) (HCC) Prior Echo Feb 2024 at [facility] showed EF 55-60%, mild AR, mild-mod MR, mild TR. Patient presenting with progressive dyspnea worse with exertion, new oxygen  requirement, chest x-ray showing mild pulmonary edema and bilateral pleural effusions, elevated proBNP.  All clinically consistent with decompensated CHF. Excellent diuresis with IV Lasix  in the ED. -- Admit to telemetry for further evaluation --Continue diuresis with IV Lasix  40 mg twice daily --Consult cardiology, appreciate recommendations --Echocardiogram ordered --Monitor renal function and electrolytes --Strict I/O's and daily weights  Type 2 diabetes mellitus with renal manifestations (HCC) Sliding scale NovoLog   Confirm home regimen pending med history verification A1c pending  Anemia of chronic disease Hemoglobin 9.4 on admission, down slightly from 11.0 back in March.  No evidence of bleeding. --Monitor CBC  SSS (sick sinus syndrome) (HCC) Status post pacemaker. Sees Cardiology at [facility].  PAF (paroxysmal atrial fibrillation) (HCC) In sinus rhythm in the ED, heart rate controlled. -- Resume Eliquis  -- Resume additional home meds regimen pending med history --Telemetry  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) Creatinine on admission 2.53, up from most recent baseline in March 1 0.8-1.9.  AKI suspicious for cardiorenal syndrome. -- Diuresis as above --Monitor renal function closely --Avoid other nephrotoxins --Maintain MAP above 65 for adequate renal perfusion      Advance Care Planning: Code status - full code  Consults: Cardiology  Family Communication: None present during virtual admission encounter  Severity of Illness: The appropriate patient status for this patient is INPATIENT. Inpatient status is judged to be reasonable and necessary in order to provide the required intensity of service to ensure the patient's  safety. The patient's presenting symptoms, physical exam findings, and initial radiographic and laboratory data in the context of their chronic comorbidities is felt to place them at high risk for further clinical deterioration. Furthermore, it is not anticipated that the patient will be medically stable for discharge from the hospital within 2 midnights of admission.   * I certify that at the point of admission it is my clinical judgment that the patient will require inpatient hospital care spanning beyond 2 midnights from the point of admission due to high intensity of service, high risk for further deterioration and high frequency of surveillance required.*  Author: Burnard DELENA Cunning, DO 10/28/2024 12:58 PM  For on call review [email or web address].      [1]  Allergies Allergen Reactions   Amoxicillin-Pot Clavulanate Swelling  Ciprofloxacin Swelling   Clotrimazole Swelling    Could not breathe. Could not breathe.    Nystatin Itching   Olmesartan Other (See Comments) and Swelling   Hydrochlorothiazide Other (See Comments)    hyponatremia    Metformin Other (See Comments)    Contraindication due to Renal Insufficiency Other reaction(s): CONTRAINDICATED DUE TO RENAL INSUFF    Other Other (See Comments)    Other reaction(s): RENAL INSUFF    Contraindicated due to Renal Insufficiency   Parathyroid Hormone (Recomb) Swelling   Spironolactone Other (See Comments)    hyponatremia   Telithromycin Other (See Comments)    Unknown Reaction per Patient    Teriparatide  Swelling   Tramadol Other (See Comments)    Urinary retention   Ace Inhibitors

## 2024-10-28 NOTE — Assessment & Plan Note (Signed)
 Hemoglobin 9.4 on admission, down slightly from 11.0 back in March.  No evidence of bleeding. --Monitor CBC

## 2024-10-28 NOTE — Assessment & Plan Note (Signed)
 Creatinine on admission 2.53, up from most recent baseline in March 1 0.8-1.9.  AKI suspicious for cardiorenal syndrome. -- Diuresis as above --Monitor renal function closely --Avoid other nephrotoxins --Maintain MAP above 65 for adequate renal perfusion

## 2024-10-28 NOTE — Assessment & Plan Note (Signed)
 In sinus rhythm in the ED, heart rate controlled. -- Resume Eliquis  -- Resume additional home meds regimen pending med history --Telemetry

## 2024-10-29 ENCOUNTER — Inpatient Hospital Stay (HOSPITAL_COMMUNITY)

## 2024-10-29 LAB — BASIC METABOLIC PANEL WITH GFR
Anion gap: 10 (ref 5–15)
BUN: 27 mg/dL — ABNORMAL HIGH (ref 8–23)
CO2: 29 mmol/L (ref 22–32)
Calcium: 8.8 mg/dL — ABNORMAL LOW (ref 8.9–10.3)
Chloride: 101 mmol/L (ref 98–111)
Creatinine, Ser: 2.24 mg/dL — ABNORMAL HIGH (ref 0.44–1.00)
GFR, Estimated: 21 mL/min — ABNORMAL LOW (ref >60)
Glucose, Bld: 85 mg/dL (ref 70–99)
Potassium: 3 mmol/L — ABNORMAL LOW (ref 3.5–5.1)
Sodium: 140 mmol/L (ref 135–145)

## 2024-10-29 LAB — CBC
HCT: 27.8 % — ABNORMAL LOW (ref 36.0–46.0)
Hemoglobin: 9.2 g/dL — ABNORMAL LOW (ref 12.0–15.0)
MCH: 30 pg (ref 26.0–34.0)
MCHC: 33.1 g/dL (ref 30.0–36.0)
MCV: 90.6 fL (ref 80.0–100.0)
Platelets: 235 K/uL (ref 150–400)
RBC: 3.07 MIL/uL — ABNORMAL LOW (ref 3.87–5.11)
RDW: 15.2 % (ref 11.5–15.5)
WBC: 4.5 K/uL (ref 4.0–10.5)
nRBC: 0 % (ref 0.0–0.2)

## 2024-10-29 LAB — GLUCOSE, CAPILLARY
Glucose-Capillary: 85 mg/dL (ref 70–99)
Glucose-Capillary: 119 mg/dL — ABNORMAL HIGH (ref 70–99)
Glucose-Capillary: 121 mg/dL — ABNORMAL HIGH (ref 70–99)
Glucose-Capillary: 139 mg/dL — ABNORMAL HIGH (ref 70–99)

## 2024-10-29 LAB — IRON AND TIBC
Iron: 32 ug/dL (ref 28–170)
Saturation Ratios: 12 % (ref 10.4–31.8)
TIBC: 270 ug/dL (ref 250–450)
UIBC: 238 ug/dL

## 2024-10-29 LAB — MAGNESIUM: Magnesium: 2 mg/dL (ref 1.7–2.4)

## 2024-10-29 LAB — FERRITIN: Ferritin: 61 ng/mL (ref 11–307)

## 2024-10-29 MED ORDER — POTASSIUM CHLORIDE CRYS ER 20 MEQ PO TBCR
40.0000 meq | EXTENDED_RELEASE_TABLET | Freq: Once | ORAL | Status: AC
  Administered 2024-10-29: 40 meq via ORAL
  Filled 2024-10-29: qty 2

## 2024-10-29 MED ORDER — METOPROLOL SUCCINATE ER 25 MG PO TB24
25.0000 mg | ORAL_TABLET | Freq: Every day | ORAL | Status: DC
  Administered 2024-10-29 – 2024-10-30 (×2): 25 mg via ORAL
  Filled 2024-10-29 (×2): qty 1

## 2024-10-29 MED ORDER — POTASSIUM CHLORIDE 10 MEQ/100ML IV SOLN
10.0000 meq | INTRAVENOUS | Status: AC
  Administered 2024-10-29 (×2): 10 meq via INTRAVENOUS
  Filled 2024-10-29 (×2): qty 100

## 2024-10-29 MED ORDER — ORAL CARE MOUTH RINSE: 15.0000 mL | OROMUCOSAL | Status: DC | PRN

## 2024-10-29 NOTE — Progress Notes (Signed)
 PROGRESS NOTE    Angie Barrett  FMW:[id number] DOB: [birth year] DOA: 10/27/2024 PCP: Abran Jon CROME, MD  Subjective: No acute events overnight. Seen and examined at bedside. Reports feeling better with improvement in shortness of breath. Tolerating oral intake without n/v. Denies constipation.   Hospital Course:   83 y.o. female with medical history significant of CKD stage IV, type 2 diabetes, HTN, HLD, A-fib who presented to [facility] ED for evaluation of worsening shortness of breath and fatigue.  Patient reports feeling extremely tired and fatigued, generally weak and having trouble with breathing.  She reports this started around Tuesday and has gotten progressively worse since that time.  Reports shortness of breath is worse when she is up moving and walking.  Denies orthopnea or PND.  She did not notice any leg swelling.  She denies being on home oxygen  at baseline.  Denies any prior episodes of similar symptoms.  She reports no recent fevers chills, sore throat cough or congestion, myalgias, abdominal pain or nausea vomiting.  She reports recent constipation, no diarrhea.  No UTI symptoms.  She follows with cardiology at [facility].   ED course: Initial vitals --temp 98.1 F, HR 81, RR 20, BP 144/48, 93% SpO2 later 89%, placed on supplemental O2 by nasal cannula and this morning is on 4 L/min/with SpO2 ranging 94-97%. Labs obtained including BMP and CBC were notable for glucose 142, BUN 35, creatinine 2.53 (compared to 1.95, 1.81 both in March), hemoglobin 9.4 (compared to 11.0 in March). proBNP elevated 9493. Imaging --two-view chest x-ray showed mild pulmonary edema with small bilateral pleural effusions and patchy bibasilar atelectasis (versus aspiration).  Cardiomegaly with aortic atherosclerosis. EKG normal sinus rhythm at 73 bpm, no ST elevation or acute ischemic changes.   Patient was treated in the ED with 40 mg IV Lasix  with excellent urine output thereafter.   Patient is being admitted  to the hospital with cardiology consulted for further evaluation and management of likely acute on chronic HFpEF complicated by acute respiratory failure with hypoxia as outlined in detail below.  Assessment and Plan:  Acute on chronic heart failure with preserved ejection fraction (HFpEF) (HCC) Prior Echo Feb 2024 at [facility] showed EF 55-60%, mild AR, mild-mod MR, mild TR. Patient presenting with progressive dyspnea worse with exertion, new oxygen  requirement, chest x-ray showing mild pulmonary edema and bilateral pleural effusions, elevated proBNP.  All clinically consistent with decompensated CHF. Excellent diuresis with IV Lasix  in the ED. --Continue diuresis with IV Lasix  40 mg twice daily --Echocardiogram pending --Monitor renal function and electrolytes -- monitor on tele --Strict I/O's and daily weights --Cardiology following   Type 2 diabetes mellitus with renal manifestations (HCC) - HgbA1c 6.1 - At home on pioglitazone which we will discontinue on discharge as it can lead to fluid retention - Sliding scale NovoLog  with FS ACHS - Confirm home regimen pending med history verification   Anemia of chronic disease - Hemoglobin 9.2 < 9.4, baseline 10-11 - No evidence of bleeding. - iron studies without iron deficiency --Monitor CBC as needed   SSS (sick sinus syndrome) (HCC) Status post pacemaker. Sees Cardiology at [facility].   PAF (paroxysmal atrial fibrillation) (HCC) In sinus rhythm in the ED, heart rate controlled. -- cont Eliquis  -- Resume home metoprolol   --Telemetry   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) Creatinine 2.24 < 2.53 Chart reviewed. Creatinine baseline labile but lately has been 1.8-2.3 AKI ruled out after careful study -- Diuresis as elsewhere --Avoid other  nephrotoxins --Maintain MAP above 65 for adequate renal perfusion --Monitor renal function closely  DVT prophylaxis: apixaban  (ELIQUIS ) tablet 2.5 mg Start:  10/28/24 1300 apixaban  (ELIQUIS ) tablet 2.5 mg  Eliquis    Code Status: Full Code Disposition Plan: Home with home health Reason for continuing need for hospitalization: IV diuresis  Objective: Vitals:   10/29/24 0246 10/29/24 0415 10/29/24 0735 10/29/24 1143  BP:  133/64 (!) 143/74 (!) 132/52  Pulse:  65 69 70  Resp:  16 17 19   Temp:  98.6 F (37 C) 98.3 F (36.8 C) 98.3 F (36.8 C)  TempSrc:  Oral Oral Oral  SpO2:  95% 94% 97%  Weight: 74.8 kg     Height:        Intake/Output Summary (Last 24 hours) at 10/29/2024 1513 Last data filed at 10/29/2024 1145 Gross per 24 hour  Intake 100 ml  Output 3250 ml  Net -3150 ml   Filed Weights   10/28/24 2000 10/29/24 0246  Weight: 74.7 kg 74.8 kg    Examination:  Physical Exam Vitals and nursing note reviewed.  Constitutional:      General: She is not in acute distress.    Appearance: She is ill-appearing.     Comments: Weak, frail  HENT:     Head: Normocephalic and atraumatic.  Cardiovascular:     Rate and Rhythm: Normal rate and regular rhythm.     Pulses: Normal pulses.     Heart sounds: Normal heart sounds.  Pulmonary:     Effort: Pulmonary effort is normal.     Breath sounds: Normal breath sounds.  Abdominal:     General: Bowel sounds are normal.     Palpations: Abdomen is soft.  Musculoskeletal:     Right lower leg: Edema (improving) present.     Left lower leg: Edema (improving) present.  Neurological:     Mental Status: She is alert. Mental status is at baseline.     Data Reviewed: I have personally reviewed following labs and imaging studies  CBC: Recent Labs  Lab 10/27/24 1432 10/29/24 0550  WBC 5.2 4.5  HGB 9.4* 9.2*  HCT 27.9* 27.8*  MCV 92.1 90.6  PLT 239 235   Basic Metabolic Panel: Recent Labs  Lab 10/27/24 1432 10/29/24 0550 10/29/24 0920  NA 138 140  --   K 3.7 3.0*  --   CL 99 101  --   CO2 24 29  --   GLUCOSE 142* 85  --   BUN 35* 27*  --   CREATININE 2.53* 2.24*  --    CALCIUM  9.7 8.8*  --   MG  --   --  2.0   GFR: Estimated Creatinine Clearance: 18.5 mL/min (A) (by C-G formula based on SCr of 2.24 mg/dL (H)). Liver Function Tests: No results for input(s): AST, ALT, ALKPHOS, BILITOT, PROT, ALBUMIN in the last 168 hours. No results for input(s): LIPASE, AMYLASE in the last 168 hours. No results for input(s): AMMONIA in the last 168 hours. Coagulation Profile: No results for input(s): INR, PROTIME in the last 168 hours. Cardiac Enzymes: No results for input(s): CKTOTAL, CKMB, CKMBINDEX, TROPONINI in the last 168 hours. ProBNP, BNP (last 5 results) Recent Labs    01/17/24 1035 10/27/24 1432  PROBNP  --  9,493.0*  BNP 1,327.2*  --    HbA1C: Recent Labs    10/28/24 1516  HGBA1C 6.1*   CBG: Recent Labs  Lab 10/28/24 1533 10/29/24 0733 10/29/24 1140  GLUCAP 132* 85 119*  Lipid Profile: No results for input(s): CHOL, HDL, LDLCALC, TRIG, CHOLHDL, LDLDIRECT in the last 72 hours. Thyroid Function Tests: No results for input(s): TSH, T4TOTAL, FREET4, T3FREE, THYROIDAB in the last 72 hours. Anemia Panel: Recent Labs    10/29/24 0550  FERRITIN 61  TIBC 270  IRON 32   Sepsis Labs: No results for input(s): PROCALCITON, LATICACIDVEN in the last 168 hours.  No results found for this or any previous visit (from the past 240 hours).   Radiology Studies: US  RENAL Result Date: 10/29/2024 CLINICAL DATA:  [id number] AKI (acute kidney injury) [id number] EXAM: RENAL / URINARY TRACT ULTRASOUND COMPLETE COMPARISON:  None Available. FINDINGS: Right Kidney: Renal measurements: 8.3 x 3.5 x 3.8 cm = volume: 58 mL. Echogenicity is mildly increased. No mass or hydronephrosis visualized. Left Kidney: Renal measurements: 10.0 x 4.1 x 4.5 cm = volume: 96 mL. Echogenicity is mildly increased. No hydronephrosis visualized. Benign cyst is noted measuring 2.6 cm (for which no dedicated imaging follow-up is  recommended) Bladder: Bladder is relatively decompressed with mild circumferential wall prominence, nonspecific. Other: Pleural effusions. IMPRESSION: 1. No hydronephrosis. 2. Mildly increased echogenicity of the kidneys as can be seen in medical renal disease. 3. Pleural effusions. Electronically Signed   By: Corean Salter M.D.   On: 10/29/2024 11:05   DG Chest Port 1 View Result Date: 10/29/2024 EXAM: 1 VIEW(S) XRAY OF THE CHEST 10/29/2024 08:08:00 AM COMPARISON: 10/27/2024 CLINICAL HISTORY: 83 year old female with dyspnea. FINDINGS: LINES, TUBES AND DEVICES: Left chest cardiac pacing device noted. LUNGS AND PLEURA: Small bilateral pleural effusions. Unchanged bibasilar airspace opacities. Mildly increased interstitial markings, no overt edema. No pneumothorax. HEART AND MEDIASTINUM: Atherosclerotic aortic calcifications. Cardiomegaly. BONES AND SOFT TISSUES: No acute osseous abnormality. IMPRESSION: 1. Bilateral small pleural effusions with associated atelectasis. 2. Vascular congestion without overt edema. Electronically signed by: Helayne Hurst MD 10/29/2024 08:14 AM EST RP Workstation: [id number]    Scheduled Meds:  apixaban   2.5 mg Oral BID   cholecalciferol   1,000 Units Oral Daily   furosemide   40 mg Intravenous BID   insulin  aspart  0-9 Units Subcutaneous TID WC   loratadine   10 mg Oral Daily   olopatadine   1 drop Both Eyes BID   pantoprazole   40 mg Oral BID AC   rosuvastatin   40 mg Oral Daily   Continuous Infusions:   LOS: 1 day   Norval Bar, MD  [facility]  10/29/2024, 3:13 PM

## 2024-10-29 NOTE — Progress Notes (Signed)
 Rounding Note   Patient Name: Angie Barrett Date of Encounter: 10/29/2024  [facility] Cardiologist: Dr Lilian HP  Subjective No CP; dyspnea resolved  Scheduled Meds:  apixaban   2.5 mg Oral BID   cholecalciferol   1,000 Units Oral Daily   furosemide   40 mg Intravenous BID   insulin  aspart  0-9 Units Subcutaneous TID WC   loratadine   10 mg Oral Daily   olopatadine   1 drop Both Eyes BID   pantoprazole   40 mg Oral BID AC   rosuvastatin   40 mg Oral Daily   Continuous Infusions:  PRN Meds: acetaminophen  **OR** acetaminophen , bisacodyl , fluticasone , polyethylene glycol   Vital Signs  Vitals:   10/29/24 0010 10/29/24 0246 10/29/24 0415 10/29/24 0735  BP: (!) 145/59  133/64 (!) 143/74  Pulse: 70  65 69  Resp: 16  16 17   Temp: 98.6 F (37 C)  98.6 F (37 C) 98.3 F (36.8 C)  TempSrc: Oral  Oral Oral  SpO2: 95%  95% 94%  Weight:  74.8 kg    Height:        Intake/Output Summary (Last 24 hours) at 10/29/2024 0755 Last data filed at 10/29/2024 0700 Gross per 24 hour  Intake 100 ml  Output 3700 ml  Net -3600 ml      10/29/2024    2:46 AM 10/28/2024    8:00 PM 10/09/2024    3:18 PM  Last 3 Weights  Weight (lbs) 164 lb 14.5 oz 164 lb 10.9 oz 164 lb 12.8 oz  Weight (kg) 74.8 kg 74.7 kg 74.753 kg      Telemetry AV pacing - Personally Reviewed   Physical Exam  GEN: No acute distress.   Neck: No JVD Cardiac: RRR, no murmurs, rubs, or gallops.  Respiratory: Clear to auscultation bilaterally. GI: Soft, nontender, non-distended  MS: No edema. Neuro:  Nonfocal  Psych: Normal affect   Labs  Chemistry Recent Labs  Lab 10/27/24 1432 10/29/24 0550  NA 138 140  K 3.7 3.0*  CL 99 101  CO2 24 29  GLUCOSE 142* 85  BUN 35* 27*  CREATININE 2.53* 2.24*  CALCIUM  9.7 8.8*  GFRNONAA 18* 21*  ANIONGAP 15 10     Hematology Recent Labs  Lab 10/27/24 1432 10/29/24 0550  WBC 5.2 4.5  RBC 3.03* 3.07*  HGB 9.4* 9.2*  HCT 27.9* 27.8*  MCV 92.1 90.6   MCH 31.0 30.0  MCHC 33.7 33.1  RDW 15.2 15.2  PLT 239 235    BNP Recent Labs  Lab 10/27/24 1432  PROBNP 9,493.0*      Radiology  DG Chest 2 View Result Date: 10/27/2024 EXAM: 2 VIEW(S) XRAY OF THE CHEST 10/27/2024 02:40:00 PM COMPARISON: 01/19/2024 CLINICAL HISTORY: SOB FINDINGS: LINES, TUBES AND DEVICES: Left subclavian approach dual lead cardiac rhythm maintenance device stable in position. LUNGS AND PLEURA: Mild pulmonary edema. Patchy bibasilar opacities. Small bilateral pleural effusions. No pneumothorax. HEART AND MEDIASTINUM: Persistent cardiomegaly. Aortic atherosclerosis. BONES AND SOFT TISSUES: No acute osseous abnormality. IMPRESSION: 1. Mild pulmonary edema with small bilateral pleural effusions and patchy bibasilar atelectasis or aspiration. 2. Cardiomegaly with aortic atherosclerosis. Electronically signed by: Greig Pique MD 10/27/2024 03:11 PM EST RP Workstation: [id number]    Patient Profile   83 year old female with past medical history of paroxysmal atrial fibrillation, history of pacemaker placement, heart failure preserved ejection fraction, diabetes mellitus, hypertension, hyperlipidemia, chronic kidney disease for evaluation of acute on chronic diastolic congestive heart failure. Last echocardiogram February 2024 at [facility] showed normal  LV function, mild aortic insufficiency, mild to moderate mitral regurgitation, enlarged left atrium. Nuclear study August 2025 showed no ischemia and normal LV function though read as small prior infarcts.   Assessment & Plan  1 acute on chronic diastolic congestive heart failure-I/O - 3480.  Symptoms have resolved.  Patient can be discharged from a cardiac standpoint after Lasix  dose this morning.  Would then treat with torsemide  20 mg daily with an additional 20 mg for weight gain of 2 to 3 pounds.  Fluid restrict to 1.5 L daily and we also discussed the importance of low-sodium diet.  She will need her potassium and renal function  checked 1 week after discharge and follow-up with Dr. Lilian in [facility].  Can consider SGLT2 inhibitor as an outpatient.     2 chronic stage IIIb kidney disease-creatinine 2.24 today.  Recheck in 1 week.   3 hypertension-would resume all preadmission medications at discharge.   4 history of atrial fibrillation-continue apixaban .  Resume carvedilol at discharge.  Patient in AV paced rhythm.   5 status post pacemaker   6 MR-mild to moderate on most recent echo.  Await follow-up study.  7 hypokalemia-supplement.  If LV function normal patient can be discharged later today with plan as outlined above.  Will need potassium and renal function check in 1 week.  Follow-up Dr. Lilian 1 to 2 weeks.  For questions or updates, please contact [facility] Please consult [email or web address] for contact info under      Signed, [name], MD  10/29/2024, 7:55 AM

## 2024-10-29 NOTE — Evaluation (Signed)
 Occupational Therapy Evaluation Patient Details Name: Angie Barrett MRN: [id number] DOB: [birth year] Today's Date: 10/29/2024   History of Present Illness   83 yo F adm 12/12 acute CHF. PMH: CHF, a-fib with PPM, DM HTN HLD CKD     Clinical Impressions Pt lives with spouse, ind at baseline with ADLs and functional mobility. Pt currently close to baseline, overall with mildly decr activity tolerance, needs CGA for ADLs, supervision for bed mobility, and CGA for transfers without AD. Pt educated on use of 3in1 as shower seat at home, and began some energy conservation education with pt. Pt presenting with impairments listed below, will follow acutely. Recommend HHOT at d/c.     If plan is discharge home, recommend the following:   A little help with walking and/or transfers;A little help with bathing/dressing/bathroom;Assistance with cooking/housework;Direct supervision/assist for medications management;Direct supervision/assist for financial management;Assist for transportation;Help with stairs or ramp for entrance     Functional Status Assessment   Patient has had a recent decline in their functional status and demonstrates the ability to make significant improvements in function in a reasonable and predictable amount of time.     Equipment Recommendations   BSC/3in1;Other (comment) (RW)     Recommendations for Other Services   PT consult     Precautions/Restrictions   Precautions Precautions: Fall Precaution/Restrictions Comments: watch SpO2 Restrictions Weight Bearing Restrictions Per Provider Order: No     Mobility Bed Mobility Overal bed mobility: Needs Assistance Bed Mobility: Supine to Sit, Sit to Supine     Supine to sit: Supervision Sit to supine: Supervision        Transfers Overall transfer level: Needs assistance Equipment used: None Transfers: Sit to/from Stand Sit to Stand: Contact guard assist                  Balance Overall  balance assessment: Mild deficits observed, not formally tested                                         ADL either performed or assessed with clinical judgement   ADL Overall ADL's : Needs assistance/impaired Eating/Feeding: Set up   Grooming: Set up;Sitting   Upper Body Bathing: Contact guard assist   Lower Body Bathing: Contact guard assist   Upper Body Dressing : Contact guard assist   Lower Body Dressing: Contact guard assist   Toilet Transfer: Contact guard assist   Toileting- Clothing Manipulation and Hygiene: Contact guard assist       Functional mobility during ADLs: Contact guard assist       Vision   Vision Assessment?: No apparent visual deficits     Perception Perception: Not tested       Praxis Praxis: Not tested       Pertinent Vitals/Pain Pain Assessment Pain Assessment: No/denies pain     Extremity/Trunk Assessment Upper Extremity Assessment Upper Extremity Assessment: Generalized weakness   Lower Extremity Assessment Lower Extremity Assessment: Defer to PT evaluation   Cervical / Trunk Assessment Cervical / Trunk Assessment: Normal   Communication Communication Communication: No apparent difficulties   Cognition Arousal: Alert Behavior During Therapy: WFL for tasks assessed/performed Cognition: No apparent impairments                               Following commands: Intact       Cueing  General Comments   Cueing Techniques: Verbal cues  VSS on RA   Exercises     Shoulder Instructions      Home Living Family/patient expects to be discharged to:: Private residence Living Arrangements: Spouse/significant other Available Help at Discharge: Family;Available 24 hours/day Type of Home: House Home Access: Stairs to enter [employer] of Steps: 2 Entrance Stairs-Rails: Right;Left Home Layout: One level (2 steps to family room)     Bathroom Shower/Tub: [employer] Adviser: Standard     Home Equipment: Cane - single point;Hand held shower head          Prior Functioning/Environment Prior Level of Function : Independent/Modified Independent             Mobility Comments: no AD ADLs Comments: indep    OT Problem List: Decreased strength;Decreased range of motion;Impaired balance (sitting and/or standing);Decreased activity tolerance   OT Treatment/Interventions: Self-care/ADL training;Therapeutic exercise;Energy conservation;DME and/or AE instruction;Therapeutic activities;Patient/family education;Balance training      OT Goals(Current goals can be found in the care plan section)   Acute Rehab OT Goals Patient Stated Goal: did not state OT Goal Formulation: With patient Time For Goal Achievement: 11/12/24 Potential to Achieve Goals: Good ADL Goals Pt Will Perform Lower Body Dressing: Independently;sitting/lateral leans;sit to/from stand Pt Will Perform Tub/Shower Transfer: Shower transfer;Independently;ambulating;3 in 1 Additional ADL Goal #1: pt will verbalize x3 energy conservation strategies in prep for ADLs   OT Frequency:  Min 2X/week    Co-evaluation              AM-PAC OT 6 Clicks Daily Activity     Outcome Measure Help from another person eating meals?: A Little Help from another person taking care of personal grooming?: A Little Help from another person toileting, which includes using toliet, bedpan, or urinal?: A Little Help from another person bathing (including washing, rinsing, drying)?: A Little Help from another person to put on and taking off regular upper body clothing?: A Little Help from another person to put on and taking off regular lower body clothing?: A Little 6 Click Score: 18   End of Session Nurse Communication: Mobility status  Activity Tolerance: Patient tolerated treatment well Patient left: in bed;with call bell/phone within reach;with bed alarm set  OT Visit Diagnosis: Unsteadiness on  feet (R26.81);Other abnormalities of gait and mobility (R26.89);Muscle weakness (generalized) (M62.81)                Time: 8586-8571 OT Time Calculation (min): 15 min Charges:  OT General Charges $OT Visit: 1 Visit OT Evaluation $OT Eval Low Complexity: 1 Low  Estha Few K, OTD, OTR/L SecureChat Preferred Acute Rehab [phone]   Jaimya Feliciano K Koonce 10/29/2024, 3:25 PM

## 2024-10-29 NOTE — Evaluation (Signed)
 Physical Therapy Evaluation Patient Details Name: Angie Barrett MRN: [id number] DOB: 16-Apr-1941 Today's Date: 10/29/2024  History of Present Illness  83 yo F adm 12/12 acute CHF. PMH: CHF, a-fib with PPM, DM HTN HLD CKD  Clinical Impression  Pt admitted with above. PTA pt reports indep without AD. Pt currently functioning at contact guard. Pt with noted DOE and required rest breaks during amb at about 75'. SpO2 at 90% on RA. Pt to benefit from rollator for energy conservation during activities and to improve ambulation safety and endurance. Pt agreeable. Pt to benefit from HHPT upon d/c to aide in transition to rollator and for improved home set up. Acute PT to cont to follow.      If plan is discharge home, recommend the following: A little help with walking and/or transfers;A little help with bathing/dressing/bathroom;Assist for transportation;Help with stairs or ramp for entrance   Can travel by private vehicle        Equipment Recommendations Rollator (4 wheels);BSC/3in1  Recommendations for Other Services       Functional Status Assessment Patient has had a recent decline in their functional status and demonstrates the ability to make significant improvements in function in a reasonable and predictable amount of time.     Precautions / Restrictions Precautions Precautions: Fall Precaution/Restrictions Comments: watch SpO2 Restrictions Weight Bearing Restrictions Per Provider Order: No      Mobility  Bed Mobility Overal bed mobility: Needs Assistance Bed Mobility: Supine to Sit     Supine to sit: Supervision     General bed mobility comments: HOB elevated, increased time    Transfers Overall transfer level: Needs assistance Equipment used: None Transfers: Sit to/from Stand Sit to Stand: Contact guard assist           General transfer comment: slow, guarded    Ambulation/Gait Ambulation/Gait assistance: Contact guard assist Gait Distance (Feet): 125  Feet Assistive device: None Gait Pattern/deviations: Step-through pattern, Decreased stride length Gait velocity: dec     General Gait Details: 1 standing rest break, SpO2 90% on RW, mild SOB/DOE  Stairs            Wheelchair Mobility     Tilt Bed    Modified Rankin (Stroke Patients Only)       Balance Overall balance assessment: Mild deficits observed, not formally tested                                           Pertinent Vitals/Pain Pain Assessment Pain Assessment: No/denies pain    Home Living Family/patient expects to be discharged to:: Private residence Living Arrangements: Spouse/significant other Available Help at Discharge: Family;Available 24 hours/day Type of Home: House Home Access: Stairs to enter Entrance Stairs-Rails: Doctor, [facility] of Steps: 2   Home Layout: One level Home Equipment: Cane - single point;Hand held shower head      Prior Function Prior Level of Function : Independent/Modified Independent             Mobility Comments: no AD ADLs Comments: indep     Extremity/Trunk Assessment   Upper Extremity Assessment Upper Extremity Assessment: Generalized weakness    Lower Extremity Assessment Lower Extremity Assessment: Generalized weakness    Cervical / Trunk Assessment Cervical / Trunk Assessment: Normal  Communication   Communication Communication: No apparent difficulties    Cognition Arousal: Alert Behavior During Therapy: WFL for tasks assessed/performed  PT - Cognitive impairments: No apparent impairments                         Following commands: Intact       Cueing Cueing Techniques: Verbal cues     General Comments General comments (skin integrity, edema, etc.): SpO2 90% on RA walking, returned to 96% s/p 2 min of sitting    Exercises     Assessment/Plan    PT Assessment Patient needs continued PT services  PT Problem List Decreased  strength;Decreased activity tolerance;Decreased balance;Decreased mobility;Decreased coordination       PT Treatment Interventions DME instruction;Gait training;Stair training;Functional mobility training;Therapeutic activities;Therapeutic exercise;Balance training    PT Goals (Current goals can be found in the Care Plan section)  Acute Rehab PT Goals Patient Stated Goal: home PT Goal Formulation: With patient Time For Goal Achievement: 11/12/24 Potential to Achieve Goals: Good    Frequency Min 2X/week     Co-evaluation               AM-PAC PT 6 Clicks Mobility  Outcome Measure Help needed turning from your back to your side while in a flat bed without using bedrails?: A Little Help needed moving from lying on your back to sitting on the side of a flat bed without using bedrails?: A Little Help needed moving to and from a bed to a chair (including a wheelchair)?: A Little Help needed standing up from a chair using your arms (e.g., wheelchair or bedside chair)?: A Little Help needed to walk in hospital room?: A Little Help needed climbing 3-5 steps with a railing? : A Little 6 Click Score: 18    End of Session Equipment Utilized During Treatment: Gait belt Activity Tolerance: Patient limited by fatigue Patient left: in chair;with call bell/phone within reach Nurse Communication: Mobility status (VSS, occluded IV) PT Visit Diagnosis: Unsteadiness on feet (R26.81)    Time: 8967-8891 PT Time Calculation (min) (ACUTE ONLY): 36 min   Charges:   PT Evaluation $PT Eval Moderate Complexity: 1 Mod PT Treatments $Gait Training: 8-22 mins PT General Charges $$ ACUTE PT VISIT: 1 Visit         Norene Ames, PT, DPT Acute Rehabilitation Services Secure chat preferred Office #: [phone]   Norene CHRISTELLA Ames 10/29/2024, 11:15 AM

## 2024-10-30 ENCOUNTER — Other Ambulatory Visit: Payer: Self-pay

## 2024-10-30 ENCOUNTER — Inpatient Hospital Stay (HOSPITAL_COMMUNITY)

## 2024-10-30 DIAGNOSIS — I5031 Acute diastolic (congestive) heart failure: Secondary | ICD-10-CM

## 2024-10-30 DIAGNOSIS — I5033 Acute on chronic diastolic (congestive) heart failure: Secondary | ICD-10-CM

## 2024-10-30 DIAGNOSIS — M7989 Other specified soft tissue disorders: Secondary | ICD-10-CM

## 2024-10-30 LAB — ECHOCARDIOGRAM COMPLETE
AR max vel: 2.97 cm2
AV Area VTI: 3.13 cm2
AV Area mean vel: 3.01 cm2
AV Mean grad: 2 mmHg
AV Peak grad: 4.6 mmHg
Ao pk vel: 1.07 m/s
Area-P 1/2: 2.76 cm2
Calc EF: 56.9 %
Height: 64 in
S' Lateral: 3.2 cm
Single Plane A2C EF: 64.8 %
Single Plane A4C EF: 45.6 %
Weight: 2632 [oz_av]

## 2024-10-30 LAB — BASIC METABOLIC PANEL WITH GFR
Anion gap: 13 (ref 5–15)
BUN: 28 mg/dL — ABNORMAL HIGH (ref 8–23)
CO2: 28 mmol/L (ref 22–32)
Calcium: 9 mg/dL (ref 8.9–10.3)
Chloride: 100 mmol/L (ref 98–111)
Creatinine, Ser: 2.23 mg/dL — ABNORMAL HIGH (ref 0.44–1.00)
GFR, Estimated: 21 mL/min — ABNORMAL LOW (ref >60)
Glucose, Bld: 102 mg/dL — ABNORMAL HIGH (ref 70–99)
Potassium: 3.4 mmol/L — ABNORMAL LOW (ref 3.5–5.1)
Sodium: 141 mmol/L (ref 135–145)

## 2024-10-30 LAB — MAGNESIUM: Magnesium: 1.9 mg/dL (ref 1.7–2.4)

## 2024-10-30 LAB — UREA NITROGEN, URINE: Urea Nitrogen, Ur: 157 mg/dL

## 2024-10-30 LAB — GLUCOSE, CAPILLARY
Glucose-Capillary: 86 mg/dL (ref 70–99)
Glucose-Capillary: 132 mg/dL — ABNORMAL HIGH (ref 70–99)

## 2024-10-30 MED ORDER — POTASSIUM CHLORIDE CRYS ER 20 MEQ PO TBCR
40.0000 meq | EXTENDED_RELEASE_TABLET | Freq: Once | ORAL | Status: AC
  Administered 2024-10-30: 09:00:00 40 meq via ORAL
  Filled 2024-10-30: qty 2

## 2024-10-30 MED ORDER — TORSEMIDE 20 MG PO TABS: 20.0000 mg | ORAL_TABLET | Freq: Every day | ORAL | Status: DC

## 2024-10-30 NOTE — Progress Notes (Signed)
°  Echocardiogram 2D Echocardiogram has been performed.  Tinnie FORBES Gosling RDCS 10/30/2024, 10:30 AM

## 2024-10-30 NOTE — TOC CM/SW Note (Signed)
°  °  Durable Medical Equipment  (From admission, onward)           Start     Ordered   10/30/24 1411  DME Walker  Once       Question Answer Comment  Walker: With 5 Inch Wheels   Patient needs a walker to treat with the following condition Acute exacerbation of CHF (congestive heart failure) (HCC)   Patient needs a walker to treat with the following condition Physical debility      10/30/24 1411   10/30/24 1411  For home use only DME 4 wheeled rolling walker with seat  Once       Question:  Patient needs a walker to treat with the following condition  Answer:  Acute exacerbation of CHF (congestive heart failure) (HCC)   10/30/24 1411   10/30/24 1357  DME 3-in-1  Once        10/30/24 1358

## 2024-10-30 NOTE — Discharge Instructions (Signed)
 Follow up with PCP within one week Follow up with nephrologist as scheduled Follow up with cardiologist within 1-2 weeks

## 2024-10-30 NOTE — Discharge Summary (Signed)
 [facility] Phys[facility] Discharge Summary   Patient name: Angie Barrett  Admit date:     10/27/2024  Discharge date: 10/30/2024  Attending Physician: FAUSTO BURNARD LABOR [[id number]]  Discharge Physician: Norval Bar   PCP: Abran Jon CROME, MD  Admitted From: Home  Disposition:  Home  Recommendations for Outpatient Follow-up:  Follow up with PCP in 1-2 weeks Follow up on repeat blood work in one week Follow up with cardiology within 1-2 weeks Follow up with nephrology as scheduled  Home Health:Yes Equipment/Devices: @ECDMELIST @  Discharge Condition:Stable CODE STATUS:FULL Diet recommendation: Heart Healthy/Diabetic Fluid Restriction: None  Hospital Summary:   83 y.o. female with medical history significant of CKD stage IV, type 2 diabetes, HTN, HLD, A-fib who presented to [facility] ED for evaluation of worsening shortness of breath and fatigue.  Patient reports feeling extremely tired and fatigued, generally weak and having trouble with breathing.  She reports this started around Tuesday and has gotten progressively worse since that time.  Reports shortness of breath is worse when she is up moving and walking.  Denies orthopnea or PND.  She did not notice any leg swelling.  She denies being on home oxygen  at baseline.  Denies any prior episodes of similar symptoms.  She reports no recent fevers chills, sore throat cough or congestion, myalgias, abdominal pain or nausea vomiting.  She reports recent constipation, no diarrhea.  No UTI symptoms.  She follows with cardiology at [facility].  Admitted for acute CHF exacerbation that was managed with IV diuresis with good response. Transient oxygen  requirement that was weaned off to room air. Patient underwent echocardiogram that showed LVEF 55-60%, normal LV function, no WMAs, grade 1 diastolic dysfunction, bicupsid aortic valve without stenosis/regurgitation, normal IVC size with > 50% respiratory variability. Followed by cardiology during  admission, appreciate recommendations. - cont home torsemide , metoprolol  and hydralazine  - stopped home amlodipine - stopped home pioglitazone as it will cause fluid retention - repeat blood work in one week - follow up with cardiology within 1-2 weeks - follow up with nephrology as scheduled - follow up with PCP within one week   Discharge Diagnoses:  Principal Problem:   Acute on chronic heart failure with preserved ejection fraction (HFpEF) (HCC) Active Problems:   Type 2 diabetes mellitus with renal manifestations (HCC)   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)   PAF (paroxysmal atrial fibrillation) (HCC)   SSS (sick sinus syndrome) (HCC)   Anemia of chronic disease   Discharge Instructions  Discharge Instructions     Increase activity slowly   Complete by: As directed       Allergies as of 10/30/2024       Reactions   Amoxicillin-pot Clavulanate Swelling   Ciprofloxacin Swelling   Clotrimazole Swelling   Could not breathe. Could not breathe.   Nystatin Itching   Olmesartan Other (See Comments), Swelling   Hydrochlorothiazide Other (See Comments)   hyponatremia   Metformin Other (See Comments)   Contraindication due to Renal Insufficiency Other reaction(s): CONTRAINDICATED DUE TO RENAL INSUFF   Other Other (See Comments)   Other reaction(s): RENAL INSUFF    Contraindicated due to Renal Insufficiency   Parathyroid Hormone (recomb) Swelling   Spironolactone Other (See Comments)   hyponatremia   Telithromycin Other (See Comments)   Unknown Reaction per Patient   Teriparatide  Swelling   Tramadol Other (See Comments)   Urinary retention   Ace Inhibitors         Medication List  STOP taking these medications    amLODipine 10 MG tablet Commonly known as: NORVASC   pioglitazone 15 MG tablet Commonly known as: ACTOS   potassium chloride  SA 20 MEQ tablet Commonly known as: KLOR-CON  M       TAKE these  medications    acetaminophen  500 MG tablet Commonly known as: TYLENOL  Take 2 tablets (1,000 mg total) by mouth every 6 (six) hours as needed.   calcitRIOL 0.5 MCG capsule Commonly known as: ROCALTROL Take 0.5 mcg by mouth daily.   calcium  carbonate 1500 (600 Ca) MG Tabs tablet Commonly known as: OSCAL Take 600 mg of elemental calcium  by mouth 2 (two) times daily with a meal.   cetirizine 10 MG tablet Commonly known as: ZYRTEC Take 10 mg by mouth daily.   cholecalciferol  25 MCG (1000 UNIT) tablet Commonly known as: VITAMIN D3 Take 1,000 Units by mouth daily.   conjugated estrogens 0.625 MG/GM vaginal cream Commonly known as: PREMARIN Place 1 Applicatorful vaginally as needed.   Dexcom G7 Sensor Misc by Does not apply route.   Eliquis  2.5 MG Tabs tablet Generic drug: apixaban  Take 2.5 mg by mouth 2 (two) times daily.   EPINEPHrine  0.3 mg/0.3 mL Soaj injection Commonly known as: EPI-PEN Inject 0.3 mg into the muscle as needed for anaphylaxis.   fluticasone  50 MCG/ACT nasal spray Commonly known as: FLONASE  Place 1 spray into both nostrils daily. What changed:  when to take this reasons to take this   gabapentin 100 MG capsule Commonly known as: NEURONTIN Take 100 mg by mouth as needed.   hydrALAZINE  25 MG tablet Commonly known as: APRESOLINE  Take 25 mg by mouth 3 (three) times daily.   ipratropium 0.06 % nasal spray Commonly known as: ATROVENT  USE 2 SPRAYS IN EACH NOSTRIL FOUR TIMES DAILY AS NEEDED FOR NASAL CONGESTION OR RUNNY NOSE   linagliptin 5 MG Tabs tablet Commonly known as: TRADJENTA Take 5 mg by mouth daily.   metoprolol  succinate 25 MG 24 hr tablet Commonly known as: TOPROL -XL Take 25 mg by mouth daily.   montelukast  10 MG tablet Commonly known as: SINGULAIR  TAKE 1 TABLET(10 MG) BY MOUTH AT BEDTIME   Olopatadine  HCl 0.2 % Soln Apply 1 drop to eye daily.   omeprazole 40 MG capsule Commonly known as: PRILOSEC Take 40 mg by mouth daily.    rosuvastatin  40 MG tablet Commonly known as: CRESTOR  Take 40 mg by mouth daily.   Slow Release Iron 45 MG Tbcr Generic drug: Ferrous Sulfate Dried Take 1 tablet by mouth 2 (two) times daily.   Theratears PF 0.25 % Soln Generic drug: Carboxymethylcellulose Sod PF Apply 1 drop to eye as needed.   tiZANidine 2 MG tablet Commonly known as: ZANAFLEX Take 2 mg by mouth as needed for muscle spasms.   torsemide  20 MG tablet Commonly known as: DEMADEX  Take 20 mg by mouth daily.               Durable Medical Equipment  (From admission, onward)           Start     Ordered   10/30/24 1357  DME 3-in-1  Once        10/30/24 1358   10/30/24 1357  DME Walker  Once       Question Answer Comment  Walker: With 5 Inch Wheels   Patient needs a walker to treat with the following condition Acute exacerbation of CHF (congestive heart failure) (HCC)   Patient needs a walker to treat  with the following condition Physical debility      10/30/24 1358            Allergies[1]  Discharge Exam: Vitals:   10/30/24 0917 10/30/24 1239  BP: (!) 141/66 (!) 141/59  Pulse: 70 70  Resp:  14  Temp:  98.4 F (36.9 C)  SpO2:  96%    Physical Exam Vitals and nursing note reviewed.  Constitutional:      General: She is not in acute distress.    Appearance: She is not ill-appearing.     Comments: Weak, frail  HENT:     Head: Normocephalic and atraumatic.  Cardiovascular:     Rate and Rhythm: Normal rate and regular rhythm.     Pulses: Normal pulses.     Heart sounds: Normal heart sounds.  Pulmonary:     Effort: Pulmonary effort is normal.     Breath sounds: Normal breath sounds.  Abdominal:     General: Bowel sounds are normal.     Palpations: Abdomen is soft.  Musculoskeletal:     Right lower leg: No edema.     Left lower leg: No edema.  Neurological:     Mental Status: She is alert. Mental status is at baseline.     The results of significant diagnostics from this  hospitalization (including imaging, microbiology, ancillary and laboratory) are listed below for reference.    Microbiology: No results found for this or any previous visit (from the past 240 hours).   Labs: ProBNP, BNP (last 5 results) Recent Labs    01/17/24 1035 10/27/24 1432  PROBNP  --  9,493.0*  BNP 1,327.2*  --    Basic Metabolic Panel: Recent Labs  Lab 10/27/24 1432 10/29/24 0550 10/29/24 0920 10/30/24 0523  NA 138 140  --  141  K 3.7 3.0*  --  3.4*  CL 99 101  --  100  CO2 24 29  --  28  GLUCOSE 142* 85  --  102*  BUN 35* 27*  --  28*  CREATININE 2.53* 2.24*  --  2.23*  CALCIUM  9.7 8.8*  --  9.0  MG  --   --  2.0 1.9   Liver Function Tests: No results for input(s): AST, ALT, ALKPHOS, BILITOT, PROT, ALBUMIN in the last 168 hours. No results for input(s): LIPASE, AMYLASE in the last 168 hours. No results for input(s): AMMONIA in the last 168 hours. CBC: Recent Labs  Lab 10/27/24 1432 10/29/24 0550  WBC 5.2 4.5  HGB 9.4* 9.2*  HCT 27.9* 27.8*  MCV 92.1 90.6  PLT 239 235   Cardiac Enzymes: No results for input(s): CKTOTAL, CKMB, CKMBINDEX, TROPONINI, TROPONINIHS in the last 168 hours. BNP: No results for input(s): BNP in the last 168 hours. CBG: Recent Labs  Lab 10/29/24 1140 10/29/24 1630 10/29/24 2146 10/30/24 0748 10/30/24 1237  GLUCAP 119* 121* 139* 86 132*   D-Dimer No results for input(s): DDIMER in the last 72 hours. Hgb A1c Recent Labs    10/28/24 1516  HGBA1C 6.1*   Lipid Profile No results for input(s): CHOL, HDL, LDLCALC, TRIG, CHOLHDL, LDLDIRECT in the last 72 hours. Thyroid function studies No results for input(s): TSH, T4TOTAL, FREET4, T3FREE, THYROIDAB in the last 72 hours.  Invalid input(s): FREET3 Anemia work up Recent Labs    10/29/24 0550  FERRITIN 61  TIBC 270  IRON 32   Urinalysis    Component Value Date/Time   COLORURINE STRAW (A) 10/28/2024 2100  APPEARANCEUR CLEAR 10/28/2024 2100   LABSPEC 1.005 10/28/2024 2100   PHURINE 5.0 10/28/2024 2100   GLUCOSEU NEGATIVE 10/28/2024 2100   HGBUR SMALL (A) 10/28/2024 2100   BILIRUBINUR NEGATIVE 10/28/2024 2100   KETONESUR NEGATIVE 10/28/2024 2100   PROTEINUR NEGATIVE 10/28/2024 2100   UROBILINOGEN 0.2 06/23/2013 1430   NITRITE NEGATIVE 10/28/2024 2100   LEUKOCYTESUR NEGATIVE 10/28/2024 2100   Sepsis Labs Recent Labs  Lab 10/27/24 1432 10/29/24 0550  WBC 5.2 4.5    Procedures/Studies: VAS US  LOWER EXTREMITY VENOUS (DVT) Result Date: 10/30/2024  Lower Venous DVT Study Patient Name:  Angie Barrett  Date of Exam:   10/30/2024 Medical Rec #: [id number]     Accession #:    [id number] Date of Birth: 04-Jul-1941     Patient Gender: F Patient Age:   46 years Exam Location:  [facility] Procedure:      VAS US  LOWER EXTREMITY VENOUS (DVT) Referring Phys: NORVAL BAR --------------------------------------------------------------------------------  Indications: Swelling.  Risk Factors: None identified. Limitations: Poor ultrasound/tissue interface. Comparison Study: No prior studies. Performing Technologist: Cordella Collet RVT  Examination Guidelines: A complete evaluation includes B-mode imaging, spectral Doppler, color Doppler, and power Doppler as needed of all accessible portions of each vessel. Bilateral testing is considered an integral part of a complete examination. Limited examinations for reoccurring indications may be performed as noted. The reflux portion of the exam is performed with the patient in reverse Trendelenburg.  +---------+---------------+---------+-----------+----------+-------------------+ RIGHT    CompressibilityPhasicitySpontaneityPropertiesThrombus Aging      +---------+---------------+---------+-----------+----------+-------------------+ CFV      Full           Yes      Yes                                       +---------+---------------+---------+-----------+----------+-------------------+ SFJ      Full                                                             +---------+---------------+---------+-----------+----------+-------------------+ FV Prox  Full                                                             +---------+---------------+---------+-----------+----------+-------------------+ FV Mid   Full                                                             +---------+---------------+---------+-----------+----------+-------------------+ FV Distal               Yes      Yes                                      +---------+---------------+---------+-----------+----------+-------------------+ PFV      Full                                                             +---------+---------------+---------+-----------+----------+-------------------+  POP      Full           Yes      Yes                                      +---------+---------------+---------+-----------+----------+-------------------+ PTV      Full                                                             +---------+---------------+---------+-----------+----------+-------------------+ PERO                                                  Not well visualized +---------+---------------+---------+-----------+----------+-------------------+   +---------+---------------+---------+-----------+----------+-------------------+ LEFT     CompressibilityPhasicitySpontaneityPropertiesThrombus Aging      +---------+---------------+---------+-----------+----------+-------------------+ CFV      Full           Yes      Yes                                      +---------+---------------+---------+-----------+----------+-------------------+ SFJ      Full                                                             +---------+---------------+---------+-----------+----------+-------------------+ FV  Prox  Full                                                             +---------+---------------+---------+-----------+----------+-------------------+ FV Mid   Full           Yes      Yes                                      +---------+---------------+---------+-----------+----------+-------------------+ FV Distal               Yes      Yes                                      +---------+---------------+---------+-----------+----------+-------------------+ PFV      Full                                                             +---------+---------------+---------+-----------+----------+-------------------+ POP      Full  Yes      Yes                                      +---------+---------------+---------+-----------+----------+-------------------+ PTV      Full                                                             +---------+---------------+---------+-----------+----------+-------------------+ PERO                                                  Not well visualized +---------+---------------+---------+-----------+----------+-------------------+    Summary: RIGHT: - There is no evidence of deep vein thrombosis in the lower extremity. However, portions of this examination were limited- see technologist comments above.  - No cystic structure found in the popliteal fossa.  LEFT: - There is no evidence of deep vein thrombosis in the lower extremity. However, portions of this examination were limited- see technologist comments above.  - No cystic structure found in the popliteal fossa.  *See table(s) above for measurements and observations.    Preliminary    ECHOCARDIOGRAM COMPLETE Result Date: 10/30/2024    ECHOCARDIOGRAM REPORT   Patient Name:   Angie Barrett Date of Exam: 10/30/2024 Medical Rec #:  [id number]    Height:       64.0 in Accession #:    [id number]   Weight:       164.5 lb Date of Birth:  05-Jan-1941    BSA:          1.800 m Patient Age:     83 years     BP:           141/66 mmHg Patient Gender: F            HR:           70 bpm. Exam Location:  Inpatient Procedure: 2D Echo, Color Doppler and Cardiac Doppler (Both Spectral and Color            Flow Doppler were utilized during procedure). Indications:    CHF Acute Diastolic I50.31  History:        Patient has no prior history of Echocardiogram examinations.                 Pacemaker; Risk Factors:Diabetes and Hypertension.  Sonographer:    Tinnie Gosling RDCS Referring Phys: [id number] KELLY A GRIFFITH IMPRESSIONS  1. Left ventricular ejection fraction, by estimation, is 55 to 60%. Left ventricular ejection fraction by 2D MOD biplane is 56.9 %. The left ventricle has normal function. The left ventricle has no regional wall motion abnormalities. There is mild concentric left ventricular hypertrophy. Left ventricular diastolic parameters are consistent with Grade I diastolic dysfunction (impaired relaxation). Elevated left atrial pressure.  2. Right ventricular systolic function is normal. The right ventricular size is normal.  3. The mitral valve is normal in structure. No evidence of mitral valve regurgitation. No evidence of mitral stenosis.  4. 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 The aortic valve is bicuspid. Aortic valve regurgitation is  not visualized. No aortic stenosis is present.  5. The inferior vena cava is normal in size with greater than 50% respiratory variability, suggesting right atrial pressure of 3 mmHg. Comparison(s): No prior Echocardiogram. FINDINGS  Left Ventricle: Left ventricular ejection fraction, by estimation, is 55 to 60%. Left ventricular ejection fraction by 2D MOD biplane is 56.9 %. The left ventricle has normal function. The left ventricle has no regional wall motion abnormalities. The left ventricular internal cavity size was normal in size. There is mild concentric left ventricular hypertrophy. Left ventricular diastolic parameters are  consistent with Grade I diastolic dysfunction (impaired relaxation). Elevated left atrial pressure. Right Ventricle: The right ventricular size is normal. No increase in right ventricular wall thickness. Right ventricular systolic function is normal. Left Atrium: Left atrial size was normal in size. Right Atrium: Right atrial size was normal in size. Pericardium: There is no evidence of pericardial effusion. Mitral Valve: The mitral valve is normal in structure. No evidence of mitral valve regurgitation. No evidence of mitral valve stenosis. Tricuspid Valve: The tricuspid valve is normal in structure. Tricuspid valve regurgitation is not demonstrated. No evidence of tricuspid stenosis. 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 The aortic valve is bicuspid. Aortic valve regurgitation is not visualized. No aortic stenosis is present. Aortic valve mean gradient measures 2.0 mmHg. Aortic valve peak gradient measures 4.6 mmHg. Aortic valve area, by VTI measures 3.13 cm. Pulmonic Valve: The pulmonic valve was not well visualized. Pulmonic valve regurgitation is not visualized. No evidence of pulmonic stenosis. Aorta: The aortic root is normal in size and structure. Venous: The inferior vena cava is normal in size with greater than 50% respiratory variability, suggesting right atrial pressure of 3 mmHg. IAS/Shunts: No atrial level shunt detected by color flow Doppler.  LEFT VENTRICLE PLAX 2D                        Biplane EF (MOD) LVIDd:         4.10 cm         LV Biplane EF:   Left LVIDs:         3.20 cm                          ventricular LV PW:         1.20 cm                          ejection LV IVS:        1.20 cm                          fraction by LVOT diam:     2.00 cm                          2D MOD LV SV:         68                               biplane is LV SV Index:   38                               56.9 %. LVOT Area:  3.14 cm LV IVRT:       111 msec         Diastology                                LV e' medial:    4.24 cm/s                                LV E/e' medial:  25.5 LV Volumes (MOD)               LV e' lateral:   4.90 cm/s LV vol d, MOD    71.9 ml       LV E/e' lateral: 22.0 A2C: LV vol d, MOD    51.3 ml A4C: LV vol s, MOD    25.3 ml A2C: LV vol s, MOD    27.9 ml A4C: LV SV MOD A2C:   46.6 ml LV SV MOD A4C:   51.3 ml LV SV MOD BP:    34.9 ml RIGHT VENTRICLE             IVC RV S prime:     14.50 cm/s  IVC diam: 1.80 cm TAPSE (M-mode): 1.8 cm LEFT ATRIUM           Index        RIGHT ATRIUM           Index LA diam:      3.40 cm 1.89 cm/m   RA Area:     14.60 cm LA Vol (A4C): 53.6 ml 29.77 ml/m  RA Volume:   33.50 ml  18.61 ml/m  AORTIC VALVE AV Area (Vmax):    2.97 cm AV Area (Vmean):   3.01 cm AV Area (VTI):     3.13 cm AV Vmax:           107.00 cm/s AV Vmean:          72.400 cm/s AV VTI:            0.217 m AV Peak Grad:      4.6 mmHg AV Mean Grad:      2.0 mmHg LVOT Vmax:         101.00 cm/s LVOT Vmean:        69.400 cm/s LVOT VTI:          0.216 m LVOT/AV VTI ratio: 1.00  AORTA Ao Root diam: 2.80 cm Ao Asc diam:  3.60 cm MITRAL VALVE MV Area (PHT): 2.76 cm     SHUNTS MV Decel Time: 275 msec     Systemic VTI:  0.22 m MV E velocity: 108.00 cm/s  Systemic Diam: 2.00 cm MV A velocity: 37.30 cm/s MV E/A ratio:  2.90 Franck Azobou Tonleu Electronically signed by Joelle Cedars Tonleu Signature Date/Time: 10/30/2024/10:41:00 AM    Final    US  RENAL Result Date: 10/29/2024 CLINICAL DATA:  [id number] AKI (acute kidney injury) [id number] EXAM: RENAL / URINARY TRACT ULTRASOUND COMPLETE COMPARISON:  None Available. FINDINGS: Right Kidney: Renal measurements: 8.3 x 3.5 x 3.8 cm = volume: 58 mL. Echogenicity is mildly increased. No mass or hydronephrosis visualized. Left Kidney: Renal measurements: 10.0 x 4.1 x 4.5 cm = volume: 96 mL. Echogenicity is mildly increased. No hydronephrosis visualized. Benign cyst is noted measuring 2.6 cm (for which no dedicated imaging  follow-up is recommended) Bladder: Bladder is relatively decompressed with mild  circumferential wall prominence, nonspecific. Other: Pleural effusions. IMPRESSION: 1. No hydronephrosis. 2. Mildly increased echogenicity of the kidneys as can be seen in medical renal disease. 3. Pleural effusions. Electronically Signed   By: Corean Salter M.D.   On: 10/29/2024 11:05   DG Chest Port 1 View Result Date: 10/29/2024 EXAM: 1 VIEW(S) XRAY OF THE CHEST 10/29/2024 08:08:00 AM COMPARISON: 10/27/2024 CLINICAL HISTORY: 83 year old female with dyspnea. FINDINGS: LINES, TUBES AND DEVICES: Left chest cardiac pacing device noted. LUNGS AND PLEURA: Small bilateral pleural effusions. Unchanged bibasilar airspace opacities. Mildly increased interstitial markings, no overt edema. No pneumothorax. HEART AND MEDIASTINUM: Atherosclerotic aortic calcifications. Cardiomegaly. BONES AND SOFT TISSUES: No acute osseous abnormality. IMPRESSION: 1. Bilateral small pleural effusions with associated atelectasis. 2. Vascular congestion without overt edema. Electronically signed by: Helayne Hurst MD 10/29/2024 08:14 AM EST RP Workstation: [id number]   DG Chest 2 View Result Date: 10/27/2024 EXAM: 2 VIEW(S) XRAY OF THE CHEST 10/27/2024 02:40:00 PM COMPARISON: 01/19/2024 CLINICAL HISTORY: SOB FINDINGS: LINES, TUBES AND DEVICES: Left subclavian approach dual lead cardiac rhythm maintenance device stable in position. LUNGS AND PLEURA: Mild pulmonary edema. Patchy bibasilar opacities. Small bilateral pleural effusions. No pneumothorax. HEART AND MEDIASTINUM: Persistent cardiomegaly. Aortic atherosclerosis. BONES AND SOFT TISSUES: No acute osseous abnormality. IMPRESSION: 1. Mild pulmonary edema with small bilateral pleural effusions and patchy bibasilar atelectasis or aspiration. 2. Cardiomegaly with aortic atherosclerosis. Electronically signed by: Greig Pique MD 10/27/2024 03:11 PM EST RP Workstation: [id number]    Time coordinating  discharge: 45 mins  SIGNED:  Norval Bar, MD [facility] 10/30/2024, 2:00 PM     [1]  Allergies Allergen Reactions   Amoxicillin-Pot Clavulanate Swelling   Ciprofloxacin Swelling   Clotrimazole Swelling    Could not breathe. Could not breathe.    Nystatin Itching   Olmesartan Other (See Comments) and Swelling   Hydrochlorothiazide Other (See Comments)    hyponatremia    Metformin Other (See Comments)    Contraindication due to Renal Insufficiency Other reaction(s): CONTRAINDICATED DUE TO RENAL INSUFF    Other Other (See Comments)    Other reaction(s): RENAL INSUFF    Contraindicated due to Renal Insufficiency   Parathyroid Hormone (Recomb) Swelling   Spironolactone Other (See Comments)    hyponatremia   Telithromycin Other (See Comments)    Unknown Reaction per Patient    Teriparatide  Swelling   Tramadol Other (See Comments)    Urinary retention   Ace Inhibitors

## 2024-10-30 NOTE — TOC Initial Note (Signed)
 Transition of Care [facility]) - Initial/Assessment Note    Patient Details  Name: Angie Barrett MRN: [id number] Date of Birth: 1941-09-29  Transition of Care [facility]) CM/SW Contact:    Sudie Erminio Deems, RN Phone Number: 10/30/2024, 2:15 PM  Clinical Narrative: Patient presented for shortness of breath. PTA Patient was from home with spouse. ICM spoke with the patient regarding home health services and the patient declined services. ICM did make the patient aware that if she needs services in the future to call her PCP.  DME rolling walker with seat and bedside commode has been ordered via [employer] since the patient has [facility] HMO. DME to be delivered to the room. Spouse will provide transportation home.                   Expected Discharge Plan: Home/Self Care Barriers to Discharge: No Barriers Identified   Patient Goals and CMS Choice Patient states their goals for this hospitalization and ongoing recovery are:: Plan to return home once stable   Choice offered to / list presented to :  (Patient has [facility] HMO- DME Choice [employer].)      Expected Discharge Plan and Services In-house Referral: NA Discharge Planning Services: CM Consult Post Acute Care Choice: Durable Medical Equipment Living arrangements for the past 2 months: Single Family Home Expected Discharge Date: 10/30/24               DME Arranged: Vannie rolling with seat, Bedside commode DME Agency: [employer]       HH Arranged: Patient Refused HH          Prior Living Arrangements/Services Living arrangements for the past 2 months: Single Family Home   Patient language and need for interpreter reviewed:: Yes Do you feel safe going back to the place where you live?: Yes      Need for Family Participation in Patient Care: Yes (Comment) Care giver support system in place?: Yes (comment)   Criminal Activity/Legal Involvement Pertinent to Current Situation/Hospitalization: No - Comment as  needed  Activities of Daily Living   ADL Screening (condition at time of admission) Independently performs ADLs?: Yes (appropriate for developmental age) Is the patient deaf or have difficulty hearing?: No Does the patient have difficulty seeing, even when wearing glasses/contacts?: No Does the patient have difficulty concentrating, remembering, or making decisions?: No  Permission Sought/Granted Permission sought to share information with : Case Manager, Family Supports, Oceanographer granted to share information with : Yes, Verbal Permission Granted     Permission granted to share info w AGENCY: [employer]        Emotional Assessment Appearance:: Appears stated age Attitude/Demeanor/Rapport: Engaged Affect (typically observed): Appropriate Orientation: : Oriented to Self, Oriented to Place, Oriented to  Time, Oriented to Situation Alcohol / Substance Use: Not Applicable Psych Involvement: No (comment)  Admission diagnosis:  Hypoxia [R09.02] CHF exacerbation (HCC) [I50.9] AKI (acute kidney injury) [N17.9] Acute congestive heart failure, unspecified heart failure type (HCC) [I50.9] Acute on chronic heart failure with preserved ejection fraction (HFpEF) (HCC) [I50.33] Patient Active Problem List   Diagnosis Date Noted   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) 10/28/2024   PAF (paroxysmal atrial fibrillation) (HCC) 10/28/2024   SSS (sick sinus syndrome) (HCC) 10/28/2024   Anemia of chronic disease 10/28/2024   Type 2 diabetes mellitus with renal manifestations (HCC) 10/28/2024   Acute on chronic heart failure with preserved ejection fraction (HFpEF) (HCC) 10/27/2024   Perennial allergic  rhinitis 09/02/2023   Angioedema 08/25/2020   PCP:  Abran Jon CROME, MD Pharmacy:   [facility] [phone] - [address], [place] - [phone] AT [facility] [address] [place] [address] Phone: [phone] Fax:  [phone]  [facility] #[id number] - [place], [place] - [address] AT [address] [place] [address] Phone: [phone] Fax: [phone]     Social Drivers of Health (SDOH) Social History: SDOH Screenings   Food Insecurity: No Food Insecurity (10/28/2024)  Housing: Low Risk (10/28/2024)  Transportation Needs: No Transportation Needs (10/28/2024)  Utilities: Not At Risk (10/28/2024)  Depression (PHQ2-9): Low Risk (07/11/2024)  Financial Resource Strain: Low Risk (06/01/2024)   Received from [facility]  Physical Activity: Insufficiently Active (06/01/2024)   Received from [facility]  Social Connections: Socially Integrated (10/28/2024)  Stress: No Stress Concern Present (06/01/2024)   Received from [facility]  Tobacco Use: Low Risk (10/28/2024)  Recent Concern: Tobacco Use - Medium Risk (08/28/2024)   Received from [facility]   SDOH Interventions:     Readmission Risk Interventions     No data to display

## 2024-10-30 NOTE — Progress Notes (Signed)
 Heart Failure Navigator Progress Note  Assessed for Heart & Vascular TOC clinic readiness.  Patient does not meet criteria due to she is seen by [facility]. No HF TOC. .   Navigator will sign off at this time.   Stephane Haddock, BSN, Scientist, clinical (histocompatibility and immunogenetics) Only

## 2024-10-30 NOTE — Plan of Care (Signed)

## 2024-10-30 NOTE — Progress Notes (Signed)
 Bilateral lower extremity venous duplex has been completed. Preliminary results can be found in CV Proc through chart review.   10/30/2024 11:59 AM Cathlyn Collet RVT

## 2024-10-30 NOTE — Progress Notes (Signed)
 Rounding Note   Patient Name: Angie Barrett Date of Encounter: 10/30/2024  [facility] Cardiologist: Dr Lilian HP  Subjective No CP or dyspnea  Scheduled Meds:  apixaban   2.5 mg Oral BID   cholecalciferol   1,000 Units Oral Daily   furosemide   40 mg Intravenous BID   insulin  aspart  0-9 Units Subcutaneous TID WC   loratadine   10 mg Oral Daily   metoprolol  succinate  25 mg Oral Daily   olopatadine   1 drop Both Eyes BID   pantoprazole   40 mg Oral BID AC   rosuvastatin   40 mg Oral Daily   Continuous Infusions:  PRN Meds: acetaminophen  **OR** acetaminophen , bisacodyl , fluticasone , mouth rinse, polyethylene glycol   Vital Signs  Vitals:   10/29/24 1550 10/29/24 1945 10/30/24 0041 10/30/24 0456  BP: (!) 149/69 (!) 147/64 (!) 159/66 (!) 141/74  Pulse: 70 70 70 70  Resp: 18 18 17 18   Temp: 98.3 F (36.8 C) 98.4 F (36.9 C) 98.4 F (36.9 C) 98.5 F (36.9 C)  TempSrc: Oral Oral Oral Oral  SpO2: 93% 94% 94% 95%  Weight:    74.6 kg  Height:        Intake/Output Summary (Last 24 hours) at 10/30/2024 0748 Last data filed at 10/30/2024 0459 Gross per 24 hour  Intake --  Output 1600 ml  Net -1600 ml      10/30/2024    4:56 AM 10/29/2024    2:46 AM 10/28/2024    8:00 PM  Last 3 Weights  Weight (lbs) 164 lb 8 oz 164 lb 14.4 oz 164 lb 10.9 oz  Weight (kg) 74.617 kg 74.798 kg 74.7 kg      Telemetry AV pacing - Personally Reviewed   Physical Exam  GEN: NAD Neck: supple Cardiac: RRR Respiratory: CTA GI: Soft, NT/ND MS: No edema. Neuro:  Grossly intact Psych: Normal affect   Labs  Chemistry Recent Labs  Lab 10/27/24 1432 10/29/24 0550 10/29/24 0920 10/30/24 0523  NA 138 140  --  141  K 3.7 3.0*  --  3.4*  CL 99 101  --  100  CO2 24 29  --  28  GLUCOSE 142* 85  --  102*  BUN 35* 27*  --  28*  CREATININE 2.53* 2.24*  --  2.23*  CALCIUM  9.7 8.8*  --  9.0  MG  --   --  2.0 1.9  GFRNONAA 18* 21*  --  21*  ANIONGAP 15 10  --  13      Hematology Recent Labs  Lab 10/27/24 1432 10/29/24 0550  WBC 5.2 4.5  RBC 3.03* 3.07*  HGB 9.4* 9.2*  HCT 27.9* 27.8*  MCV 92.1 90.6  MCH 31.0 30.0  MCHC 33.7 33.1  RDW 15.2 15.2  PLT 239 235    BNP Recent Labs  Lab 10/27/24 1432  PROBNP 9,493.0*      Radiology  US  RENAL Result Date: 10/29/2024 CLINICAL DATA:  [id number] AKI (acute kidney injury) [id number] EXAM: RENAL / URINARY TRACT ULTRASOUND COMPLETE COMPARISON:  None Available. FINDINGS: Right Kidney: Renal measurements: 8.3 x 3.5 x 3.8 cm = volume: 58 mL. Echogenicity is mildly increased. No mass or hydronephrosis visualized. Left Kidney: Renal measurements: 10.0 x 4.1 x 4.5 cm = volume: 96 mL. Echogenicity is mildly increased. No hydronephrosis visualized. Benign cyst is noted measuring 2.6 cm (for which no dedicated imaging follow-up is recommended) Bladder: Bladder is relatively decompressed with mild circumferential wall prominence, nonspecific. Other: Pleural effusions. IMPRESSION: 1.  No hydronephrosis. 2. Mildly increased echogenicity of the kidneys as can be seen in medical renal disease. 3. Pleural effusions. Electronically Signed   By: Corean Salter M.D.   On: 10/29/2024 11:05   DG Chest Port 1 View Result Date: 10/29/2024 EXAM: 1 VIEW(S) XRAY OF THE CHEST 10/29/2024 08:08:00 AM COMPARISON: 10/27/2024 CLINICAL HISTORY: 83 year old female with dyspnea. FINDINGS: LINES, TUBES AND DEVICES: Left chest cardiac pacing device noted. LUNGS AND PLEURA: Small bilateral pleural effusions. Unchanged bibasilar airspace opacities. Mildly increased interstitial markings, no overt edema. No pneumothorax. HEART AND MEDIASTINUM: Atherosclerotic aortic calcifications. Cardiomegaly. BONES AND SOFT TISSUES: No acute osseous abnormality. IMPRESSION: 1. Bilateral small pleural effusions with associated atelectasis. 2. Vascular congestion without overt edema. Electronically signed by: Helayne Hurst MD 10/29/2024 08:14 AM EST RP Workstation:  [id number]    Patient Profile   83 year old female with past medical history of paroxysmal atrial fibrillation, history of pacemaker placement, heart failure preserved ejection fraction, diabetes mellitus, hypertension, hyperlipidemia, chronic kidney disease for evaluation of acute on chronic diastolic congestive heart failure. Last echocardiogram February 2024 at [facility] showed normal LV function, mild aortic insufficiency, mild to moderate mitral regurgitation, enlarged left atrium. Nuclear study August 2025 showed no ischemia and normal LV function though read as small prior infarcts.   Assessment & Plan  1 acute on chronic diastolic congestive heart failure-I/O - 5080 since admission.  Symptoms have resolved and she is euvolemic on exam.  Await echocardiogram.  If LV function preserved patient can be discharged from a cardiac standpoint and follow-up with her cardiologist in [facility] (Dr. Lilian).  Would continue home dose of Demadex  at 20 mg daily.  I have asked her to weigh daily and take an additional 20 mg of Demadex  for weight gain of 2 to 3 pounds in 1 day.  We also discussed low-sodium diet and fluid restriction.  Will check potassium and renal function in 1 week.  Would consider SGLT2 inhibitor as an outpatient with Dr. Lilian.     2 chronic stage IIIb kidney disease-creatinine unchanged today (2.23).  Would repeat in 1 week.  She needs follow-up with her nephrologist.   3 hypertension-would resume amlodipine 5 mg daily at DC; follow BP as outpt and adjust regimen as needed.    4 history of atrial fibrillation-continue apixaban  and toprol .  Patient in AV paced rhythm.   5 status post pacemaker   6 MR-mild to moderate on most recent echo.  FU echo pending.  7 hypokalemia-supplement.  If LV function normal patient can be discharged later today with plan as outlined above.  Will need potassium and renal function check in 1 week.  Follow-up Dr. Lilian 1 to 2 weeks. Will also need fu with  nephrology.  For questions or updates, please contact [facility] Please consult [email or web address] for contact info under      Signed, [name], MD  10/30/2024, 7:48 AM

## 2024-11-18 ENCOUNTER — Other Ambulatory Visit: Payer: Self-pay | Admitting: Internal Medicine

## 2024-12-22 ENCOUNTER — Emergency Department (HOSPITAL_BASED_OUTPATIENT_CLINIC_OR_DEPARTMENT_OTHER)
Admission: EM | Admit: 2024-12-22 | Discharge: 2024-12-22 | Discharge status: Against Medical Advice | Source: Home / Self Care

## 2024-12-22 ENCOUNTER — Other Ambulatory Visit: Payer: Self-pay

## 2024-12-22 NOTE — ED Triage Notes (Signed)
 Pt arrives with complaints of difficulty urinating since this morning. States her nephrologist told her to stop taking her lasix  due to her blood pressure being too low. Last time urinating any was this am and last time taking medication was yesterday. Denies pain

## 2025-04-10 ENCOUNTER — Ambulatory Visit: Admitting: Internal Medicine
